# Patient Record
Sex: Male | Born: 1950
Health system: Southern US, Community
[De-identification: ages and names within clinical notes are randomized; demographics above are authoritative.]

## PROBLEM LIST (undated history)

## (undated) DIAGNOSIS — E538 Deficiency of other specified B group vitamins: Secondary | ICD-10-CM

## (undated) DIAGNOSIS — T7840XA Allergy, unspecified, initial encounter: Secondary | ICD-10-CM

## (undated) DIAGNOSIS — Z9989 Dependence on other enabling machines and devices: Secondary | ICD-10-CM

## (undated) DIAGNOSIS — E079 Disorder of thyroid, unspecified: Secondary | ICD-10-CM

## (undated) DIAGNOSIS — G4730 Sleep apnea, unspecified: Secondary | ICD-10-CM

## (undated) DIAGNOSIS — G4733 Obstructive sleep apnea (adult) (pediatric): Secondary | ICD-10-CM

## (undated) DIAGNOSIS — I10 Essential (primary) hypertension: Secondary | ICD-10-CM

## (undated) DIAGNOSIS — E785 Hyperlipidemia, unspecified: Secondary | ICD-10-CM

## (undated) HISTORY — DX: Allergy, unspecified, initial encounter: T78.40XA

## (undated) HISTORY — DX: Obstructive sleep apnea (adult) (pediatric): G47.33

## (undated) HISTORY — DX: Disorder of thyroid, unspecified: E07.9

## (undated) HISTORY — DX: Essential (primary) hypertension: I10

## (undated) HISTORY — DX: Deficiency of other specified B group vitamins: E53.8

## (undated) HISTORY — DX: Hyperlipidemia, unspecified: E78.5

## (undated) HISTORY — PX: COLONOSCOPY: SHX174

## (undated) HISTORY — DX: Obstructive sleep apnea (adult) (pediatric): Z99.89

## (undated) HISTORY — DX: Sleep apnea, unspecified: G47.30

## (undated) HISTORY — PX: HERNIA REPAIR: SHX51

## (undated) HISTORY — PX: POLYPECTOMY: SHX149

---

## 1998-07-29 ENCOUNTER — Ambulatory Visit (HOSPITAL_COMMUNITY): Admission: RE | Admit: 1998-07-29 | Discharge: 1998-07-29 | Payer: Self-pay | Admitting: Internal Medicine

## 1998-07-29 ENCOUNTER — Encounter: Payer: Self-pay | Admitting: Internal Medicine

## 2000-12-15 ENCOUNTER — Ambulatory Visit (HOSPITAL_COMMUNITY): Admission: RE | Admit: 2000-12-15 | Discharge: 2000-12-15 | Payer: Self-pay | Admitting: *Deleted

## 2000-12-31 ENCOUNTER — Encounter: Payer: Self-pay | Admitting: Emergency Medicine

## 2000-12-31 ENCOUNTER — Emergency Department (HOSPITAL_COMMUNITY): Admission: EM | Admit: 2000-12-31 | Discharge: 2000-12-31 | Payer: Self-pay | Admitting: Emergency Medicine

## 2001-01-15 ENCOUNTER — Ambulatory Visit (HOSPITAL_BASED_OUTPATIENT_CLINIC_OR_DEPARTMENT_OTHER): Admission: RE | Admit: 2001-01-15 | Discharge: 2001-01-15 | Payer: Self-pay | Admitting: Pulmonary Disease

## 2001-05-04 ENCOUNTER — Ambulatory Visit (HOSPITAL_BASED_OUTPATIENT_CLINIC_OR_DEPARTMENT_OTHER): Admission: RE | Admit: 2001-05-04 | Discharge: 2001-05-04 | Payer: Self-pay | Admitting: Pulmonary Disease

## 2004-02-25 ENCOUNTER — Ambulatory Visit: Payer: Self-pay | Admitting: Internal Medicine

## 2004-04-02 ENCOUNTER — Ambulatory Visit: Payer: Self-pay | Admitting: Internal Medicine

## 2004-12-24 ENCOUNTER — Ambulatory Visit: Payer: Self-pay | Admitting: Internal Medicine

## 2005-03-08 ENCOUNTER — Ambulatory Visit: Payer: Self-pay | Admitting: Internal Medicine

## 2005-06-14 ENCOUNTER — Ambulatory Visit: Payer: Self-pay | Admitting: Internal Medicine

## 2005-06-20 ENCOUNTER — Ambulatory Visit: Payer: Self-pay | Admitting: Internal Medicine

## 2006-01-12 ENCOUNTER — Ambulatory Visit: Payer: Self-pay | Admitting: Internal Medicine

## 2006-05-02 ENCOUNTER — Ambulatory Visit: Payer: Self-pay

## 2006-05-02 ENCOUNTER — Ambulatory Visit: Payer: Self-pay | Admitting: Endocrinology

## 2006-05-02 HISTORY — PX: OTHER SURGICAL HISTORY: SHX169

## 2006-12-16 ENCOUNTER — Encounter: Payer: Self-pay | Admitting: Internal Medicine

## 2006-12-16 DIAGNOSIS — I10 Essential (primary) hypertension: Secondary | ICD-10-CM | POA: Insufficient documentation

## 2006-12-16 DIAGNOSIS — G4733 Obstructive sleep apnea (adult) (pediatric): Secondary | ICD-10-CM | POA: Insufficient documentation

## 2007-03-07 ENCOUNTER — Ambulatory Visit: Payer: Self-pay | Admitting: Internal Medicine

## 2007-03-07 LAB — CONVERTED CEMR LAB
ALT: 31 units/L (ref 0–53)
AST: 27 units/L (ref 0–37)
Albumin: 4 g/dL (ref 3.5–5.2)
Alkaline Phosphatase: 57 units/L (ref 39–117)
BUN: 11 mg/dL (ref 6–23)
Basophils Absolute: 0 10*3/uL (ref 0.0–0.1)
Basophils Relative: 0.3 % (ref 0.0–1.0)
Bilirubin, Direct: 0.1 mg/dL (ref 0.0–0.3)
CO2: 31 meq/L (ref 19–32)
Calcium: 9.3 mg/dL (ref 8.4–10.5)
Chloride: 102 meq/L (ref 96–112)
Cholesterol: 190 mg/dL (ref 0–200)
Creatinine, Ser: 1 mg/dL (ref 0.4–1.5)
Eosinophils Absolute: 0.3 10*3/uL (ref 0.0–0.6)
Eosinophils Relative: 6.2 % — ABNORMAL HIGH (ref 0.0–5.0)
GFR calc Af Amer: 100 mL/min
GFR calc non Af Amer: 82 mL/min
Glucose, Bld: 99 mg/dL (ref 70–99)
HCT: 40 % (ref 39.0–52.0)
HDL: 29.8 mg/dL — ABNORMAL LOW (ref >39.0)
Hemoglobin: 14.1 g/dL (ref 13.0–17.0)
LDL Cholesterol: 125 mg/dL — ABNORMAL HIGH (ref 0–99)
Lymphocytes Relative: 36.8 % (ref 12.0–46.0)
MCHC: 35.2 g/dL (ref 30.0–36.0)
MCV: 85.1 fL (ref 78.0–100.0)
Monocytes Absolute: 0.4 10*3/uL (ref 0.2–0.7)
Monocytes Relative: 8.6 % (ref 3.0–11.0)
Neutro Abs: 2.5 10*3/uL (ref 1.4–7.7)
Neutrophils Relative %: 48.1 % (ref 43.0–77.0)
PSA: 0.35 ng/mL (ref 0.10–4.00)
Platelets: 212 10*3/uL (ref 150–400)
Potassium: 4 meq/L (ref 3.5–5.1)
RBC: 4.7 M/uL (ref 4.22–5.81)
RDW: 11.8 % (ref 11.5–14.6)
Sodium: 139 meq/L (ref 135–145)
TSH: 9.47 microintl units/mL — ABNORMAL HIGH (ref 0.35–5.50)
Total Bilirubin: 1 mg/dL (ref 0.3–1.2)
Total CHOL/HDL Ratio: 6.4
Total Protein: 7.6 g/dL (ref 6.0–8.3)
Triglycerides: 178 mg/dL — ABNORMAL HIGH (ref 0–149)
VLDL: 36 mg/dL (ref 0–40)
WBC: 5.1 10*3/uL (ref 4.5–10.5)

## 2007-03-15 ENCOUNTER — Ambulatory Visit: Payer: Self-pay | Admitting: Internal Medicine

## 2007-03-15 DIAGNOSIS — E039 Hypothyroidism, unspecified: Secondary | ICD-10-CM | POA: Insufficient documentation

## 2007-03-21 ENCOUNTER — Telehealth: Payer: Self-pay | Admitting: Internal Medicine

## 2007-05-08 ENCOUNTER — Telehealth: Payer: Self-pay | Admitting: Internal Medicine

## 2008-03-13 ENCOUNTER — Telehealth: Payer: Self-pay | Admitting: Internal Medicine

## 2008-03-14 ENCOUNTER — Ambulatory Visit: Payer: Self-pay | Admitting: Internal Medicine

## 2008-03-19 LAB — CONVERTED CEMR LAB
ALT: 24 units/L (ref 0–53)
AST: 26 units/L (ref 0–37)
Albumin: 4.2 g/dL (ref 3.5–5.2)
Alkaline Phosphatase: 54 units/L (ref 39–117)
BUN: 14 mg/dL (ref 6–23)
Bilirubin Urine: NEGATIVE
Bilirubin, Direct: 0.1 mg/dL (ref 0.0–0.3)
CO2: 31 meq/L (ref 19–32)
Calcium: 9.6 mg/dL (ref 8.4–10.5)
Chloride: 103 meq/L (ref 96–112)
Cholesterol: 192 mg/dL (ref 0–200)
Creatinine, Ser: 1.1 mg/dL (ref 0.4–1.5)
GFR calc Af Amer: 89 mL/min
GFR calc non Af Amer: 74 mL/min
Glucose, Bld: 91 mg/dL (ref 70–99)
HDL: 34.2 mg/dL — ABNORMAL LOW (ref >39.0)
Hemoglobin, Urine: NEGATIVE
Ketones, ur: NEGATIVE mg/dL
LDL Cholesterol: 135 mg/dL — ABNORMAL HIGH (ref 0–99)
Leukocytes, UA: NEGATIVE
Nitrite: NEGATIVE
PSA: 0.29 ng/mL (ref 0.10–4.00)
Potassium: 4.9 meq/L (ref 3.5–5.1)
Sodium: 141 meq/L (ref 135–145)
Specific Gravity, Urine: 1.01 (ref 1.000–1.03)
TSH: 2.27 microintl units/mL (ref 0.35–5.50)
Total Bilirubin: 1.1 mg/dL (ref 0.3–1.2)
Total CHOL/HDL Ratio: 5.6
Total Protein, Urine: NEGATIVE mg/dL
Total Protein: 7.9 g/dL (ref 6.0–8.3)
Triglycerides: 114 mg/dL (ref 0–149)
Urine Glucose: NEGATIVE mg/dL
Urobilinogen, UA: 0.2 (ref 0.0–1.0)
VLDL: 23 mg/dL (ref 0–40)
pH: 7 (ref 5.0–8.0)

## 2008-04-01 ENCOUNTER — Telehealth: Payer: Self-pay | Admitting: Internal Medicine

## 2008-12-23 ENCOUNTER — Ambulatory Visit: Payer: Self-pay | Admitting: Internal Medicine

## 2009-02-28 DIAGNOSIS — E538 Deficiency of other specified B group vitamins: Secondary | ICD-10-CM

## 2009-02-28 HISTORY — DX: Deficiency of other specified B group vitamins: E53.8

## 2009-11-13 ENCOUNTER — Ambulatory Visit: Payer: Self-pay | Admitting: Internal Medicine

## 2009-11-13 DIAGNOSIS — E538 Deficiency of other specified B group vitamins: Secondary | ICD-10-CM | POA: Insufficient documentation

## 2009-11-13 DIAGNOSIS — M25519 Pain in unspecified shoulder: Secondary | ICD-10-CM | POA: Insufficient documentation

## 2009-11-13 DIAGNOSIS — R209 Unspecified disturbances of skin sensation: Secondary | ICD-10-CM | POA: Insufficient documentation

## 2009-11-14 LAB — CONVERTED CEMR LAB
BUN: 13 mg/dL (ref 6–23)
CO2: 31 meq/L (ref 19–32)
Calcium: 9.2 mg/dL (ref 8.4–10.5)
Chloride: 106 meq/L (ref 96–112)
Creatinine, Ser: 1.1 mg/dL (ref 0.4–1.5)
GFR calc non Af Amer: 77.05 mL/min (ref >60)
Glucose, Bld: 93 mg/dL (ref 70–99)
Potassium: 5 meq/L (ref 3.5–5.1)
Sodium: 142 meq/L (ref 135–145)
TSH: 2.14 microintl units/mL (ref 0.35–5.50)
Vitamin B-12: 122 pg/mL — ABNORMAL LOW (ref 211–911)

## 2009-11-17 ENCOUNTER — Ambulatory Visit: Payer: Self-pay | Admitting: Internal Medicine

## 2009-11-17 ENCOUNTER — Encounter (INDEPENDENT_AMBULATORY_CARE_PROVIDER_SITE_OTHER): Payer: Self-pay | Admitting: *Deleted

## 2009-11-18 ENCOUNTER — Telehealth: Payer: Self-pay | Admitting: Internal Medicine

## 2009-12-14 ENCOUNTER — Encounter (INDEPENDENT_AMBULATORY_CARE_PROVIDER_SITE_OTHER): Payer: Self-pay | Admitting: *Deleted

## 2009-12-16 ENCOUNTER — Ambulatory Visit: Payer: Self-pay | Admitting: Gastroenterology

## 2009-12-30 ENCOUNTER — Ambulatory Visit: Payer: Self-pay | Admitting: Gastroenterology

## 2009-12-30 LAB — HM COLONOSCOPY

## 2010-01-01 ENCOUNTER — Encounter: Payer: Self-pay | Admitting: Gastroenterology

## 2010-02-19 ENCOUNTER — Ambulatory Visit: Payer: Self-pay | Admitting: Internal Medicine

## 2010-02-19 LAB — CONVERTED CEMR LAB
BUN: 15 mg/dL (ref 6–23)
CO2: 27 meq/L (ref 19–32)
Calcium: 8.9 mg/dL (ref 8.4–10.5)
Chloride: 101 meq/L (ref 96–112)
Creatinine, Ser: 0.9 mg/dL (ref 0.4–1.5)
GFR calc non Af Amer: 90.8 mL/min (ref >60.00)
Glucose, Bld: 88 mg/dL (ref 70–99)
Potassium: 3.9 meq/L (ref 3.5–5.1)
Sodium: 136 meq/L (ref 135–145)
Vitamin B-12: 581 pg/mL (ref 211–911)

## 2010-02-26 ENCOUNTER — Ambulatory Visit: Payer: Self-pay | Admitting: Internal Medicine

## 2010-02-26 DIAGNOSIS — L570 Actinic keratosis: Secondary | ICD-10-CM | POA: Insufficient documentation

## 2010-03-30 NOTE — Letter (Signed)
Summary: Pre Visit Letter Revised  [facility]  [address] [place], [place] [address]   Phone: [phone]  Fax: [phone]        11/17/2009 MRN: [id number] Shane Short [address] [place], [place]  [address]             Procedure Date:  12-30-09   Welcome to the Gastroenterology Division at [facility].    You are scheduled to see a nurse for your pre-procedure visit on 12-16-09 at 4:30p.m. on the 3rd floor at [facility], [address]  We ask that you try to arrive at our office 15 minutes prior to your appointment time to allow for check-in.  Please take a minute to review the attached form.  If you answer "Yes" to one or more of the questions on the first page, we ask that you call the person listed at your earliest opportunity.  If you answer "No" to all of the questions, please complete the rest of the form and bring it to your appointment.    Your nurse visit will consist of discussing your medical and surgical history, your immediate family medical history, and your medications.   If you are unable to list all of your medications on the form, please bring the medication bottles to your appointment and we will list them.  We will need to be aware of both prescribed and over the counter drugs.  We will need to know exact dosage information as well.    Please be prepared to read and sign documents such as consent forms, a financial agreement, and acknowledgement forms.  If necessary, and with your consent, a friend or relative is welcome to sit-in on the nurse visit with you.  Please bring your insurance card so that we may make a copy of it.  If your insurance requires a referral to see a specialist, please bring your referral form from your primary care physician.  No co-pay is required for this nurse visit.     If you cannot keep your appointment, please call [phone] to cancel or reschedule prior to your appointment date.  This allows  [place] the opportunity to schedule an appointment for another patient in need of care.    Thank you for choosing [facility] for your medical needs.  We appreciate the opportunity to care for you.  Please visit [place] at our website  to learn more about our practice.  Sincerely, The Gastroenterology Division

## 2010-03-30 NOTE — Procedures (Signed)
Summary: Colonoscopy  Patient: Shane Short Note: All result statuses are Final unless otherwise noted.  Tests: (1) Colonoscopy (COL)   COL Colonoscopy           DONE     [facility]     [address]     [place], [place]  [address]           COLONOSCOPY PROCEDURE REPORT           PATIENT:  Shane Short  MR#:  [id number]     BIRTHDATE:  1950/06/28, 59 yrs. old  GENDER:  male     ENDOSCOPIST:  Vania Rea. Jarold Motto, MD, [facility]     REF. BY:  Linda Hedges. Plotnikov, M.D.     PROCEDURE DATE:  12/30/2009     PROCEDURE:  Colonoscopy with snare polypectomy     ASA CLASS:  Class II     INDICATIONS:  Elevated Risk Screening, family history of colon     cancer MOTHER     MEDICATIONS:   Fentanyl 75 mcg IV, Versed 7 mg IV           DESCRIPTION OF PROCEDURE:   After the risks benefits and     alternatives of the procedure were thoroughly explained, informed     consent was obtained.  Digital rectal exam was performed and     revealed no abnormalities.   The LB 180AL [id number] endoscope was     introduced through the anus and advanced to the cecum, which was     identified by both the appendix and ileocecal valve, without     limitations.  The quality of the prep was excellent, using     MoviPrep.  The instrument was then slowly withdrawn as the colon     was fully examined.     <<PROCEDUREIMAGES>>           FINDINGS:  Moderate diverticulosis was found in the sigmoid to     descending colon segments.  A sessile polyp was found in the     sigmoid colon. FLAT POLYP HOT SNARE EXCISED.   Retroflexed     views in the rectum revealed no abnormalities.    The scope was     then withdrawn from the patient and the procedure completed.           COMPLICATIONS:  None     ENDOSCOPIC IMPRESSION:     1) Moderate diverticulosis in the sigmoid to descending colon     segments     2) Sessile polyp in the sigmoid colon     R/O ADENOMA.     RECOMMENDATIONS:     1) high fiber diet     2) Repeat  Colonoscopy in 5 years.     REPEAT EXAM:  No           ______________________________     Vania Rea. Jarold Motto, MD, [facility]           CC:           n.     eSIGNED:   Vania Rea. Patterson at 12/30/2009 11:19 AM           Link Snuffer, [id number]  Note: An exclamation mark (!) indicates a result that was not dispersed into the flowsheet. Document Creation Date: 12/30/2009 11:20 AM _______________________________________________________________________  (1) Order result status: Final Collection or observation date-time: 12/30/2009 11:16 Requested date-time:  Receipt date-time:  Reported date-time:  Referring Physician:   Ordering  Physician: Sheryn Bison [phone]) Specimen Source:  Source: Launa Grill Order Number: [id number] Lab site:   Appended Document: Colonoscopy     Procedures Next Due Date:    Colonoscopy: 12/2014

## 2010-03-30 NOTE — Letter (Signed)
Summary: [facility] Instructions  [facility]  [address] [place], [place] [address]   Phone: [phone]  Fax: [phone]       Shane Short    [birth year]    MRN: [id number]        Procedure Day Dorna Bloom:  Wednesday 12/30/2009     Arrival Time:  10:00 am      Procedure Time: 11:00 am     Location of Procedure:                    _ x_  [facility] (4th Floor)                        PREPARATION FOR COLONOSCOPY WITH MOVIPREP   Starting 5 days prior to your procedure Friday 10/28 do not eat nuts, seeds, popcorn, corn, beans, peas,  salads, or any raw vegetables.  Do not take any fiber supplements (e.g. Metamucil, Citrucel, and Benefiber).  THE DAY BEFORE YOUR PROCEDURE         DATE: Tuesday 11/1  1.  Drink clear liquids the entire day-NO SOLID FOOD  2.  Do not drink anything colored red or purple.  Avoid juices with pulp.  No orange juice.  3.  Drink at least 64 oz. (8 glasses) of fluid/clear liquids during the day to prevent dehydration and help the prep work efficiently.  CLEAR LIQUIDS INCLUDE: Water Jello Ice Popsicles Tea (sugar ok, no milk/cream) Powdered fruit flavored drinks Coffee (sugar ok, no milk/cream) Gatorade Juice: apple, white grape, white cranberry  Lemonade Clear bullion, consomm, broth Carbonated beverages (any kind) Strained chicken noodle soup Hard Candy                             4.  In the morning, mix first dose of MoviPrep solution:    Empty 1 Pouch A and 1 Pouch B into the disposable container    Add lukewarm drinking water to the top line of the container. Mix to dissolve    Refrigerate (mixed solution should be used within 24 hrs)  5.  Begin drinking the prep at 5:00 p.m. The MoviPrep container is divided by 4 marks.   Every 15 minutes drink the solution down to the next mark (approximately 8 oz) until the full liter is complete.   6.  Follow completed prep with 16 oz of clear liquid of your choice  (Nothing red or purple).  Continue to drink clear liquids until bedtime.  7.  Before going to bed, mix second dose of MoviPrep solution:    Empty 1 Pouch A and 1 Pouch B into the disposable container    Add lukewarm drinking water to the top line of the container. Mix to dissolve    Refrigerate  THE DAY OF YOUR PROCEDURE      DATE: Wednesday 11/2  Beginning at 6:00 a.m. (5 hours before procedure):         1. Every 15 minutes, drink the solution down to the next mark (approx 8 oz) until the full liter is complete.  2. Follow completed prep with 16 oz. of clear liquid of your choice.    3. You may drink clear liquids until 9:00 am (2 HOURS BEFORE PROCEDURE).   MEDICATION INSTRUCTIONS  Unless otherwise instructed, you should take regular prescription medications with a small sip of water   as early as possible the morning  of your procedure.         OTHER INSTRUCTIONS  You will need a responsible adult at least 59 years of age to accompany you and drive you home.   This person must remain in the waiting room during your procedure.  Wear loose fitting clothing that is easily removed.  Leave jewelry and other valuables at home.  However, you may wish to bring a book to read or  an iPod/MP3 player to listen to music as you wait for your procedure to start.  Remove all body piercing jewelry and leave at home.  Total time from sign-in until discharge is approximately 2-3 hours.  You should go home directly after your procedure and rest.  You can resume normal activities the  day after your procedure.  The day of your procedure you should not:   Drive   Make legal decisions   Operate machinery   Drink alcohol   Return to work  You will receive specific instructions about eating, activities and medications before you leave.    The above instructions have been reviewed and explained to me by   [name], RN______________________    I fully understand and  can verbalize these instructions _____________________________ Date _________

## 2010-03-30 NOTE — Assessment & Plan Note (Signed)
Summary: start B12 inj per AVP   Vital Signs:  Patient profile:   60 year old male Weight:      199 pounds Temp:     97.7 degrees F oral Pulse rate:   68 / minute Pulse rhythm:   regular Resp:     16 per minute BP sitting:   108 / 78  (left arm) Cuff size:   regular  Vitals Entered By: Lanier Prude, Beverly Gust) (November 17, 2009 3:46 PM) CC: Low B12 Is Patient Diabetic? No   CC:  Low B12.  History of Present Illness: F/u abn Vit B12 test  Current Medications (verified): 1)  Toprol Xl 25 Mg Xr24h-Tab (Metoprolol Succinate) .... Take 1 Tablet By Mouth Once A Day 2)  Synthroid 100 Mcg Tabs (Levothyroxine Sodium) .Marland Kitchen.. 1 Qd 3)  Fish Oil   Oil (Fish Oil) .Marland Kitchen.. 1 By Mouth Once Daily 4)  Aspir-Low 81 Mg Tbec (Aspirin) .Marland Kitchen.. 1 Once Daily After Meal 5)  Vitamin D3 1000 Unit  Tabs (Cholecalciferol) .Marland Kitchen.. 1 Qd 6)  Ibuprofen 600 Mg Tabs (Ibuprofen) .Marland Kitchen.. 1 By Mouth Bid  Pc X 1 Wk Then As Needed For  Pain 7)  Tramadol Hcl 50 Mg Tabs (Tramadol Hcl) .Marland Kitchen.. 1-2 Tabs By Mouth Two Times A Day As Needed Pain  Allergies (verified): No Known Drug Allergies  Review of Systems       Tired, dizzy  Physical Exam  General:  Well-developed,well-nourished,in no acute distress; alert,appropriate and cooperative throughout examination Mouth:  Oral mucosa and oropharynx without lesions or exudates.  Teeth in good repair. Lungs:  Normal respiratory effort, chest expands symmetrically. Lungs are clear to auscultation, no crackles or wheezes. Heart:  Normal rate and regular rhythm. S1 and S2 normal without gallop, murmur, click, rub or other extra sounds. Abdomen:  Bowel sounds positive,abdomen soft and non-tender without masses, organomegaly or hernias noted.   Impression & Recommendations:  Problem # 1:  VITAMIN B12 DEFICIENCY (ICD-266.2) Assessment New Condition discussed as well as Rx options. See meds Orders: Vit B12 1000 mcg (J3420) Admin of Therapeutic Inj  intramuscular or subcutaneous  (29562)  Complete Medication List: 1)  Toprol Xl 25 Mg Xr24h-tab (Metoprolol succinate) .... Take 1 tablet by mouth once a day 2)  Synthroid 100 Mcg Tabs (Levothyroxine sodium) .Marland Kitchen.. 1 qd 3)  Fish Oil Oil (Fish oil) .Marland Kitchen.. 1 by mouth once daily 4)  Aspir-low 81 Mg Tbec (Aspirin) .Marland Kitchen.. 1 once daily after meal 5)  Vitamin D3 1000 Unit Tabs (Cholecalciferol) .Marland Kitchen.. 1 qd 6)  Ibuprofen 600 Mg Tabs (Ibuprofen) .Marland Kitchen.. 1 by mouth bid  pc x 1 wk then as needed for  pain 7)  Tramadol Hcl 50 Mg Tabs (Tramadol hcl) .Marland Kitchen.. 1-2 tabs by mouth two times a day as needed pain 8)  Cyanocobalamin 1000 Mcg/ml Soln (Cyanocobalamin) .Marland Kitchen.. 1 ml subcutaneously qd x 5 d, then q 1 wk weekly x 4 weeks, then q 2 wks  Patient Instructions: 1)  Please schedule a follow-up appointment in 3 months. 2)  BMP prior to visit, ICD-9: 3)  Vit B12 266.20 Prescriptions: CYANOCOBALAMIN 1000 MCG/ML SOLN (CYANOCOBALAMIN) 1 ml subcutaneously qd x 5 d, then q 1 wk weekly x 4 weeks, then q 2 wks  #30 ml x 6   Entered and Authorized by:   Tresa Garter MD   Signed by:   Tresa Garter MD on 11/17/2009   Method used:   Print then Give to Patient   RxID:  [id number]    Medication Administration  Injection # 1:    Medication: Vit B12 1000 mcg    Diagnosis: VITAMIN B12 DEFICIENCY (ICD-266.2)    Route: IM    Site: L deltoid    Exp Date: 07/30/2011    Lot #: [id number]    Mfr: American Regent    Patient tolerated injection without complications    Given by: Lanier Prude, CMA(AAMA) (November 17, 2009 4:43 PM)  Orders Added: 1)  Vit B12 1000 mcg [J3420] 2)  Admin of Therapeutic Inj  intramuscular or subcutaneous [96372] 3)  Est. Patient Level III [13086]

## 2010-03-30 NOTE — Progress Notes (Signed)
  Phone Note Call from Patient Call back at Home Phone [phone]   Summary of Call: Pt's wife called. They are requesting rx for needles for B12. OK?  Initial call taken by: Lamar Sprinkles, CMA,  November 18, 2009 1:56 PM  Follow-up for Phone Call        ok Thx Follow-up by: Tresa Garter MD,  November 18, 2009 5:43 PM    New/Updated Medications: * SYRINGE 23GUAGE NEEDLE use w/b12 Prescriptions: SYRINGE 23GUAGE NEEDLE use w/b12  #3 x 3   Entered by:   Lamar Sprinkles, CMA   Authorized by:   Tresa Garter MD   Signed by:   Lamar Sprinkles, CMA on 11/18/2009   Method used:   Faxed to ...       [facility]* (retail)       [address] [place], [place]  [address]       Ph: [phone]       Fax: [phone]   RxID:   [id number]

## 2010-03-30 NOTE — Letter (Signed)
Summary: Patient Notice- Polyp Results  [facility]  [address] [place], [place] [address]   Phone: [phone]  Fax: [phone]        January 01, 2010 MRN: [id number]    Shane Short [address] [place], [place]  [address]    Dear Mr. Shane Short,  I am pleased to inform you that the colon polyp(s) removed during your recent colonoscopy was (were) found to be benign (no cancer detected) upon pathologic examination.  I recommend you have a repeat colonoscopy examination in 5_ years to look for recurrent polyps, as having colon polyps increases your risk for having recurrent polyps or even colon cancer in the future.  Should you develop new or worsening symptoms of abdominal pain, bowel habit changes or bleeding from the rectum or bowels, please schedule an evaluation with either your primary care physician or with me.  Additional information/recommendations:  _XX_ No further action with gastroenterology is needed at this time. Please      follow-up with your primary care physician for your other healthcare      needs.  __ Please call [phone] to schedule a return visit to review your      situation.  __ Please keep your follow-up visit as already scheduled.  __ Continue treatment plan as outlined the day of your exam.  Please call Shane Short if you are having persistent problems or have questions about your condition that have not been fully answered at this time.  Sincerely,  Mardella Layman MD [facility]  This letter has been electronically signed by your physician.  Appended Document: Patient Notice- Polyp Results letter mailed

## 2010-03-30 NOTE — Miscellaneous (Signed)
Summary: direct COL--ch./previsit  Clinical Lists Changes  Medications: Added new medication of MOVIPREP 100 GM  SOLR (PEG-KCL-NACL-NASULF-NA ASC-C) As directed - Signed Rx of MOVIPREP 100 GM  SOLR (PEG-KCL-NACL-NASULF-NA ASC-C) As directed;  #1 x 0;  Signed;  Entered by: [name] RN;  Authorized by: Mardella Layman MD [facility];  Method used: Electronically to [facility]*, [address] [place], [place], [place]  [address], Ph: [phone], Fax: [phone] Observations: Added new observation of ALLERGY REV: Done (12/16/2009 16:19)    Prescriptions: MOVIPREP 100 GM  SOLR (PEG-KCL-NACL-NASULF-NA ASC-C) As directed  #1 x 0   Entered by:   [name] RN   Authorized by:   Mardella Layman MD [facility]   Signed by:   [name] RN on 12/16/2009   Method used:   Electronically to        [facility]* (retail)       [address] [place], [place]  [address]       Ph: [phone]       Fax: [phone]   RxID:   [id number]

## 2010-03-30 NOTE — Assessment & Plan Note (Signed)
Summary: FU/ NWS  #   Vital Signs:  Patient profile:   60 year old male Weight:      201 pounds O2 Sat:      96 % on Room air Temp:     97.4 degrees F oral Pulse rate:   51 / minute BP sitting:   118 / 80  (left arm) Cuff size:   regular  Vitals Entered By: Bill Salinas CMA (November 13, 2009 7:51 AM)  O2 Flow:  Room air  History of Present Illness: The patient presents for a follow up of hypertension, hyperlipidemia  C/o constant L shoulder pain and tingling in L arm/hand. L shoulder w/decr ROM   Current Medications (verified): 1)  Toprol Xl 25 Mg Xr24h-Tab (Metoprolol Succinate) .... Take 1 Tablet By Mouth Once A Day 2)  Synthroid 100 Mcg Tabs (Levothyroxine Sodium) .Marland Kitchen.. 1 Qd 3)  Fish Oil   Oil (Fish Oil) .Marland Kitchen.. 1 By Mouth Once Daily 4)  Aspir-Low 81 Mg Tbec (Aspirin) .Marland Kitchen.. 1 Once Daily After Meal 5)  Vitamin D3 1000 Unit  Tabs (Cholecalciferol) .Marland Kitchen.. 1 Qd  Allergies (verified): No Known Drug Allergies  Past History:  Past Medical History: Hypertension Hypothyroidism OSA on CPAP Low HDL Vit B12 2011  Social History: Occupation: lifting at work Married Quit smoking 30 years ago  Review of Systems  The patient denies fever, chest pain, prolonged cough, abdominal pain, and difficulty walking.    Physical Exam  General:  Well-developed,well-nourished,in no acute distress; alert,appropriate and cooperative throughout examination Eyes:  No corneal or conjunctival inflammation noted. EOMI. Perrla. Funduscopic exam benign, without hemorrhages, exudates or papilledema. Vision grossly normal. Nose:  External nasal examination shows no deformity or inflammation. Nasal mucosa are pink and moist without lesions or exudates. Mouth:  Oral mucosa and oropharynx without lesions or exudates.  Teeth in good repair. Lungs:  Normal respiratory effort, chest expands symmetrically. Lungs are clear to auscultation, no crackles or wheezes. Heart:  Normal rate and regular rhythm. S1 and  S2 normal without gallop, murmur, click, rub or other extra sounds. Abdomen:  Bowel sounds positive,abdomen soft and non-tender without masses, organomegaly or hernias noted. Msk:  Lumbar-sacral spine is tender to palpation over paraspinal muscles and painfull with the decreased ROM  L shoulder w/decr ROM and tender Neurologic:  WNL Skin:  Intact without suspicious lesions or rashes Psych:  Cognition and judgment appear intact. Alert and cooperative with normal attention span and concentration. No apparent delusions, illusions, hallucinations   Impression & Recommendations:  Problem # 1:  SHOULDER PAIN (ICD-719.41) L shoulder impingement Assessment New  His updated medication list for this problem includes:    Aspir-low 81 Mg Tbec (Aspirin) .Marland Kitchen... 1 once daily after meal    Ibuprofen 600 Mg Tabs (Ibuprofen) .Marland Kitchen... 1 by mouth bid  pc x 1 wk then as needed for  pain    Tramadol Hcl 50 Mg Tabs (Tramadol hcl) .Marland Kitchen... 1-2 tabs by mouth two times a day as needed pain RTC for inj and Xray if not better in 3-4 wks  Problem # 2:  PARESTHESIA (ICD-782.0) L arm Assessment: New  Orders: TLB-B12, Serum-Total ONLY ([address]-U04) TLB-BMP (Basic Metabolic Panel-BMET) (80048-METABOL) TLB-TSH (Thyroid Stimulating Hormone) (84443-TSH)  Problem # 3:  HYPOTHYROIDISM (ICD-244.9) Assessment: Unchanged  His updated medication list for this problem includes:    Synthroid 100 Mcg Tabs (Levothyroxine sodium) .Marland Kitchen... 1 qd  Orders: TLB-B12, Serum-Total ONLY ([address]-J19) TLB-BMP (Basic Metabolic Panel-BMET) (80048-METABOL) TLB-TSH (Thyroid Stimulating Hormone) (84443-TSH)  Problem #  4:  HYPERTENSION (ICD-401.9) Assessment: Unchanged  His updated medication list for this problem includes:    Toprol Xl 25 Mg Xr24h-tab (Metoprolol succinate) .Marland Kitchen... Take 1 tablet by mouth once a day  Orders: TLB-B12, Serum-Total ONLY ([address]-U04) TLB-BMP (Basic Metabolic Panel-BMET) (80048-METABOL) TLB-TSH (Thyroid Stimulating  Hormone) (84443-TSH)  Problem # 5:  PARESTHESIA (ICD-782.0) Assessment: New  Orders: TLB-B12, Serum-Total ONLY ([address]-J19) TLB-BMP (Basic Metabolic Panel-BMET) (80048-METABOL) TLB-TSH (Thyroid Stimulating Hormone) (84443-TSH)  Problem # 6:  VITAMIN B12 DEFICIENCY (ICD-266.2) Assessment: New Start Rx next wk  Complete Medication List: 1)  Toprol Xl 25 Mg Xr24h-tab (Metoprolol succinate) .... Take 1 tablet by mouth once a day 2)  Synthroid 100 Mcg Tabs (Levothyroxine sodium) .Marland Kitchen.. 1 qd 3)  Fish Oil Oil (Fish oil) .Marland Kitchen.. 1 by mouth once daily 4)  Aspir-low 81 Mg Tbec (Aspirin) .Marland Kitchen.. 1 once daily after meal 5)  Vitamin D3 1000 Unit Tabs (Cholecalciferol) .Marland Kitchen.. 1 qd 6)  Ibuprofen 600 Mg Tabs (Ibuprofen) .Marland Kitchen.. 1 by mouth bid  pc x 1 wk then as needed for  pain 7)  Tramadol Hcl 50 Mg Tabs (Tramadol hcl) .Marland Kitchen.. 1-2 tabs by mouth two times a day as needed pain  Other Orders: Admin 1st Vaccine ([address]) Flu Vaccine 35yrs + ([address]) Gastroenterology Referral (GI)  Patient Instructions: 1)  Use stretching and balance exercises that I have provided (15 min. or longer every day)  2)  Contour pillow 3)  Please schedule a follow-up appointment in 6 months well w/labs. Prescriptions: SYNTHROID 100 MCG TABS (LEVOTHYROXINE SODIUM) 1 qd  #30 Each x 11   Entered and Authorized by:   Tresa Garter MD   Signed by:   Tresa Garter MD on 11/13/2009   Method used:   Electronically to        [facility]* (retail)       [address] [place], [place]  [address]       Ph: [phone]       Fax: [phone]   RxID:   [id number] TOPROL XL 25 MG XR24H-TAB (METOPROLOL SUCCINATE) Take 1 tablet by mouth once a day  #30 Each x 11   Entered and Authorized by:   Tresa Garter MD   Signed by:   Tresa Garter MD on 11/13/2009   Method used:   Electronically to        [facility]* (retail)       [address] [place], [place]  [address]       Ph: [phone]       Fax: [phone]   RxID:   [id number] TRAMADOL HCL 50 MG TABS (TRAMADOL HCL) 1-2 tabs by mouth two times a day as needed pain  #100 x 1   Entered and Authorized by:   Tresa Garter MD   Signed by:   Tresa Garter MD on 11/13/2009   Method used:   Electronically to        [facility]* (retail)       [address] [place], [place]  [address]       Ph: [phone]       Fax: [phone]   RxID:   [id number] IBUPROFEN 600 MG TABS (IBUPROFEN) 1 by  mouth bid  pc x 1 wk then as needed for  pain  #60 x 3   Entered and Authorized by:   Tresa Garter MD   Signed by:   Tresa Garter MD on 11/13/2009   Method used:   Electronically to        [facility]* (retail)       [address] [place], [place]  [address]       Ph: [phone]       Fax: [phone]   RxID:   [id number]      Flu Vaccine Consent Questions     Do you have a history of severe allergic reactions to this vaccine? no    Any prior history of allergic reactions to egg and/or gelatin? no    Do you have a sensitivity to the preservative Thimersol? no    Do you have a past history of Guillan-Barre Syndrome? no    Do you currently have an acute febrile illness? no    Have you ever had a severe reaction to latex? no    Vaccine information given and explained to patient? yes    Are you currently pregnant? no    Lot Number:[id number]   Exp Date:08/28/2010   Site Given  Left Deltoid IMflu

## 2010-04-01 NOTE — Assessment & Plan Note (Signed)
Summary: 3 mo rov /nws  #   Vital Signs:  Patient profile:   60 year old male Weight:      186 pounds Temp:     98.8 degrees F oral Pulse rate:   76 / minute Pulse rhythm:   regular Resp:     16 per minute BP sitting:   112 / 76  (left arm) Cuff size:   regular  Vitals Entered By: Lanier Prude, CMA(AAMA) (February 26, 2010 2:50 PM) CC: 3 mo f/u  Is Patient Diabetic? No Comments pt is not taking Fish oil, ibuprofen or Tramadol   CC:  3 mo f/u .  History of Present Illness: F/u B12 def, HTN, hypothyroid C/o skin growth on face  Current Medications (verified): 1)  Toprol Xl 25 Mg Xr24h-Tab (Metoprolol Succinate) .... Take 1 Tablet By Mouth Once A Day 2)  Synthroid 100 Mcg Tabs (Levothyroxine Sodium) .Marland Kitchen.. 1 Qd 3)  Fish Oil   Oil (Fish Oil) .Marland Kitchen.. 1 By Mouth Once Daily 4)  Aspir-Low 81 Mg Tbec (Aspirin) .Marland Kitchen.. 1 Once Daily After Meal 5)  Vitamin D3 1000 Unit  Tabs (Cholecalciferol) .Marland Kitchen.. 1 Qd 6)  Ibuprofen 600 Mg Tabs (Ibuprofen) .Marland Kitchen.. 1 By Mouth Bid  Pc X 1 Wk Then As Needed For  Pain 7)  Tramadol Hcl 50 Mg Tabs (Tramadol Hcl) .Marland Kitchen.. 1-2 Tabs By Mouth Two Times A Day As Needed Pain 8)  Cyanocobalamin 1000 Mcg/ml Soln (Cyanocobalamin) .Marland Kitchen.. 1 Ml Subcutaneously Qd X 5 D, Then Q 1 Wk Weekly X 4 Weeks, Then Q 2 Wks 9)  3ml Syringe 23guage Needle .... Use W/b12  Allergies (verified): No Known Drug Allergies  Past History:  Past Medical History: Last updated: 11/13/2009 Hypertension Hypothyroidism OSA on CPAP Low HDL Vit B12 2011  Social History: Last updated: 11/13/2009 Occupation: lifting at work Married Quit smoking 30 years ago  Review of Systems  The patient denies anorexia, chest pain, syncope, hemoptysis, and abdominal pain.    Physical Exam  General:  Well-developed,well-nourished,in no acute distress; alert,appropriate and cooperative throughout examination Head:  Normocephalic and atraumatic without obvious abnormalities. Eyes:  No corneal or conjunctival  inflammation noted. EOMI. Perrla Nose:  External nasal examination shows no deformity or inflammation. Nasal mucosa are pink and moist without lesions or exudates. Mouth:  Oral mucosa and oropharynx without lesions or exudates.  Teeth in good repair. Neck:  No deformities, masses, or tenderness noted. Lungs:  Normal respiratory effort, chest expands symmetrically. Lungs are clear to auscultation, no crackles or wheezes. Heart:  Normal rate and regular rhythm. S1 and S2 normal without gallop, murmur, click, rub or other extra sounds. Abdomen:  Bowel sounds positive,abdomen soft and non-tender without masses, organomegaly or hernias noted. Msk:  Lumbar-sacral spine is tender to palpation over paraspinal muscles and painfull with the decreased ROM  L shoulder w/decr ROM and tender Extremities:  B no edema Neurologic:  No cranial nerve deficits noted. Station and gait are normal. Plantar reflexes are down-going bilaterally. DTRs are symmetrical throughout. Sensory, motor and coordinative functions appear intact. Skin:  L temple AK x2 and x1 on R Psych:  Cognition and judgment appear intact. Alert and cooperative with normal attention span and concentration. No apparent delusions, illusions, hallucinations   Impression & Recommendations:  Problem # 1:  VITAMIN B12 DEFICIENCY (ICD-266.2) Assessment Improved On the regimen of medicine(s) reflected in the chart   The labs were reviewed with the patient.   Problem # 2:  HYPOTHYROIDISM (ICD-244.9) Assessment: Unchanged  His updated medication list for this problem includes:    Synthroid 100 Mcg Tabs (Levothyroxine sodium) .Marland Kitchen... 1 qd  Labs Reviewed: TSH: 2.14 (11/13/2009)    Chol: 192 (03/14/2008)   HDL: 34.2 (03/14/2008)   LDL: 135 (03/14/2008)   TG: 114 (03/14/2008)  Problem # 3:  HYPERTENSION (ICD-401.9) Assessment: Unchanged  His updated medication list for this problem includes:    Toprol Xl 25 Mg Xr24h-tab (Metoprolol succinate) .Marland Kitchen...  Take 1 tablet by mouth once a day  BP today: 112/76 Prior BP: 108/78 (11/17/2009)  Labs Reviewed: K+: 3.9 (02/19/2010) Creat: : 0.9 (02/19/2010)   Chol: 192 (03/14/2008)   HDL: 34.2 (03/14/2008)   LDL: 135 (03/14/2008)   TG: 114 (03/14/2008)  Problem # 4:  SHOULDER PAIN (ICD-719.41) resolved Assessment: Improved  His updated medication list for this problem includes:    Aspir-low 81 Mg Tbec (Aspirin) .Marland Kitchen... 1 once daily after meal    Ibuprofen 600 Mg Tabs (Ibuprofen) .Marland Kitchen... 1 by mouth bid  pc x 1 wk then as needed for  pain    Tramadol Hcl 50 Mg Tabs (Tramadol hcl) .Marland Kitchen... 1-2 tabs by mouth two times a day as needed pain  Problem # 5:  ACTINIC KERATOSIS (ICD-702.0) Assessment: New  Procedure: cryo Indication: AK(s)x3 Risks incl. scar(s), incomplete removal, ect.  and benefits discussed    3  lesion(s) on face was/were treated with liqid N2 in usual fasion.  Tolerated well. Compl. none. Wound care instructions given.   Orders: Cryotherapy/Destruction benign or premalignant lesion (1st lesion)  (17000) Cryotherapy/Destruction benign or premalignant lesion (2nd-14th lesions) (17003)  Complete Medication List: 1)  Toprol Xl 25 Mg Xr24h-tab (Metoprolol succinate) .... Take 1 tablet by mouth once a day 2)  Synthroid 100 Mcg Tabs (Levothyroxine sodium) .Marland Kitchen.. 1 qd 3)  Fish Oil Oil (Fish oil) .Marland Kitchen.. 1 by mouth once daily 4)  Aspir-low 81 Mg Tbec (Aspirin) .Marland Kitchen.. 1 once daily after meal 5)  Vitamin D3 1000 Unit Tabs (Cholecalciferol) .Marland Kitchen.. 1 qd 6)  Ibuprofen 600 Mg Tabs (Ibuprofen) .Marland Kitchen.. 1 by mouth bid  pc x 1 wk then as needed for  pain 7)  Tramadol Hcl 50 Mg Tabs (Tramadol hcl) .Marland Kitchen.. 1-2 tabs by mouth two times a day as needed pain 8)  Cyanocobalamin 1000 Mcg/ml Soln (Cyanocobalamin) .Marland Kitchen.. 1 ml subcutaneously qd x 5 d, then q 1 wk weekly x 4 weeks, then q 2 wks 9)  3ml Syringe 23guage Needle  .... Use w/b12  Patient Instructions: 1)  Please schedule a follow-up appointment in 6 months well  w/lbs and Vit B12 266.20.   Orders Added: 1)  Est. Patient Level IV [16109] 2)  Cryotherapy/Destruction benign or premalignant lesion (1st lesion)  [17000] 3)  Cryotherapy/Destruction benign or premalignant lesion (2nd-14th lesions) [17003]

## 2010-04-27 ENCOUNTER — Telehealth: Payer: Self-pay | Admitting: Internal Medicine

## 2010-05-06 NOTE — Progress Notes (Signed)
Summary: refill/syringes  Phone Note Refill Request   Refills Requested: Medication #1:  SYRINGE 23GUAGE NEEDLE use w/b12. Initial call taken by: Burnard Leigh [facility]),  April 27, 2010 10:47 AM    Prescriptions: SYRINGE 23GUAGE NEEDLE use w/b12  #3 x 3   Entered by:   Burnard Leigh CMA(AAMA)   Authorized by:   Tresa Garter MD   Signed by:   Burnard Leigh [facility]) on 04/27/2010   Method used:   Faxed to ...       [facility] [address] (retail)       [phone]       [place], [place]  [address]       Ph: [phone]       Fax: [phone]   RxID:   [id number]

## 2010-07-16 NOTE — Cardiovascular Report (Signed)
[place]  Patient:    JOHNE, BUCKLE Visit Number: [id number] MRN: [id number]          Service Type: CAT Location: [facility] 2899 12 Attending Physician:  Daisey Must Dictated by:   Daisey Must, M.D. [facility] Proc. Date: 12/15/00 Admit Date:  12/15/2000 Discharge Date: 12/15/2000   CC:         Sonda Primes, M.D. [facility]  Cardiac Catheterization Laboratory   Cardiac Catheterization  PROCEDURES PERFORMED: Left heart catheterization with coronary angiography and left ventriculography.  INDICATIONS: The patient is a 60 year old male, with a history of chest discomfort and dyspnea. A stress Cardiolite done in the office showed attenuation of the inferior base and a possible small apical wall infarction with no evidence of ischemia. Ejection fraction was 52% by Cardiolite. We did an echocardiogram which showed mild generalized hypokinesis with an ejection fraction estimated at 45-55%. Because of the mild LV dysfunction and possibility of prior infarct, we opted to proceed with cardiac catheterization to rule out coronary artery disease.  DESCRIPTION OF PROCEDURE: A 6 French sheath was placed in the right femoral artery. Standard Judkins 6 French catheters were utilized. Contrast was Omnipaque. There were no complications.  RESULTS:  HEMODYNAMICS: Left ventricular pressure 130/14. Aortic pressure 130/82.  There was no aortic valve gradient.  LEFT VENTRICULOGRAM: There is very mild hypokinesis particularly involving the anterior apical walls. Ejection fraction is calculated at 57%. There is no mitral regurgitation.  CORONARY ARTERIOGRAPHY: (Left dominant).  Left main: Normal.  Left anterior descending: The left anterior descending artery has diffuse luminal irregularities in the mid to distal vessel. The LAD gives rise to a normal sized branching first diagonal and a small second diagonal.  Left circumflex: The left circumflex is a  dominant vessel. It gives rise to a normal sized ramus intermedius, small OM-1, normal sized OM-2. The distal circumflex gives rise to a normal sized first posterolateral branch, a small second posterolateral branch and a normal sized posterior descending artery. There are diffuse minor luminal irregularities in the distal circumflex.  Right coronary artery: The right coronary artery is a relatively small nondominant vessel. It does supply a few septal perforators. It is free of angiographic disease.  IMPRESSIONS: 1. Left ventricular systolic function in the lower range of normal. 2. No significant coronary artery disease identified. Dictated by:   Daisey Must, M.D. [facility] Attending Physician:  Daisey Must DD:  12/15/00 TD:  12/16/00 Job: [id number]

## 2010-08-20 ENCOUNTER — Other Ambulatory Visit: Payer: Self-pay | Admitting: Internal Medicine

## 2010-08-20 ENCOUNTER — Other Ambulatory Visit: Payer: Self-pay

## 2010-08-20 DIAGNOSIS — E538 Deficiency of other specified B group vitamins: Secondary | ICD-10-CM

## 2010-08-20 DIAGNOSIS — Z0000 Encounter for general adult medical examination without abnormal findings: Secondary | ICD-10-CM

## 2010-08-20 DIAGNOSIS — Z0389 Encounter for observation for other suspected diseases and conditions ruled out: Secondary | ICD-10-CM

## 2010-08-23 ENCOUNTER — Other Ambulatory Visit (INDEPENDENT_AMBULATORY_CARE_PROVIDER_SITE_OTHER): Payer: Self-pay

## 2010-08-23 DIAGNOSIS — E538 Deficiency of other specified B group vitamins: Secondary | ICD-10-CM

## 2010-08-23 DIAGNOSIS — Z0389 Encounter for observation for other suspected diseases and conditions ruled out: Secondary | ICD-10-CM

## 2010-08-23 DIAGNOSIS — Z0000 Encounter for general adult medical examination without abnormal findings: Secondary | ICD-10-CM

## 2010-08-23 LAB — URINALYSIS
Bilirubin Urine: NEGATIVE
Hgb urine dipstick: NEGATIVE
Leukocytes, UA: NEGATIVE
Nitrite: NEGATIVE
pH: 6 (ref 5.0–8.0)

## 2010-08-23 LAB — CBC WITH DIFFERENTIAL/PLATELET
Basophils Relative: 0.6 % (ref 0.0–3.0)
Eosinophils Relative: 7 % — ABNORMAL HIGH (ref 0.0–5.0)
Hemoglobin: 13.9 g/dL (ref 13.0–17.0)
Lymphocytes Relative: 34.9 % (ref 12.0–46.0)
Monocytes Relative: 7.9 % (ref 3.0–12.0)
Neutro Abs: 2.9 10*3/uL (ref 1.4–7.7)
RBC: 4.65 Mil/uL (ref 4.22–5.81)

## 2010-08-23 LAB — TSH: TSH: 2.27 u[IU]/mL (ref 0.35–5.50)

## 2010-08-23 LAB — HEPATIC FUNCTION PANEL
Albumin: 4.2 g/dL (ref 3.5–5.2)
Alkaline Phosphatase: 62 U/L (ref 39–117)
Total Bilirubin: 0.5 mg/dL (ref 0.3–1.2)

## 2010-08-23 LAB — LIPID PANEL
HDL: 36.9 mg/dL — ABNORMAL LOW (ref >39.00)
LDL Cholesterol: 124 mg/dL — ABNORMAL HIGH (ref 0–99)
Total CHOL/HDL Ratio: 5
Triglycerides: 129 mg/dL (ref 0.0–149.0)
VLDL: 25.8 mg/dL (ref 0.0–40.0)

## 2010-08-23 LAB — BASIC METABOLIC PANEL
CO2: 29 mEq/L (ref 19–32)
Chloride: 103 mEq/L (ref 96–112)
Potassium: 5 mEq/L (ref 3.5–5.1)
Sodium: 139 mEq/L (ref 135–145)

## 2010-08-25 ENCOUNTER — Encounter: Payer: Self-pay | Admitting: Internal Medicine

## 2010-08-27 ENCOUNTER — Ambulatory Visit (INDEPENDENT_AMBULATORY_CARE_PROVIDER_SITE_OTHER): Payer: BC Managed Care – PPO | Admitting: Internal Medicine

## 2010-08-27 ENCOUNTER — Encounter: Payer: Self-pay | Admitting: Internal Medicine

## 2010-08-27 VITALS — BP 116/72 | HR 64 | Temp 97.7°F | Resp 16 | Ht 69.0 in | Wt 198.0 lb

## 2010-08-27 DIAGNOSIS — E538 Deficiency of other specified B group vitamins: Secondary | ICD-10-CM

## 2010-08-27 DIAGNOSIS — Z0000 Encounter for general adult medical examination without abnormal findings: Secondary | ICD-10-CM

## 2010-08-27 DIAGNOSIS — I10 Essential (primary) hypertension: Secondary | ICD-10-CM

## 2010-08-27 DIAGNOSIS — E039 Hypothyroidism, unspecified: Secondary | ICD-10-CM

## 2010-08-27 DIAGNOSIS — Z23 Encounter for immunization: Secondary | ICD-10-CM

## 2010-08-27 NOTE — Assessment & Plan Note (Signed)
We discussed age appropriate health related issues, including available/recomended screening tests and vaccinations. We discussed a need for adhering to healthy diet and exercise. Labs/EKG were reviewed/ordered. All questions were answered.   

## 2010-08-27 NOTE — Assessment & Plan Note (Signed)
On shots 

## 2010-08-27 NOTE — Assessment & Plan Note (Signed)
On Rx 

## 2010-08-27 NOTE — Assessment & Plan Note (Signed)
On rx 

## 2010-08-27 NOTE — Patient Instructions (Signed)
Wt Readings from Last 3 Encounters:  08/27/10 198 lb (89.812 kg)  02/26/10 186 lb (84.369 kg)  11/17/09 199 lb (90.266 kg)    Loose weight please.

## 2010-08-27 NOTE — Progress Notes (Signed)
  Subjective:    Patient ID: Shane Short, male    DOB: 01/22/1951, 60 y.o.   MRN: [id number]  HPI  He appears well, in no apparent distress.  Alert and oriented times three, pleasant and cooperative. Vital signs are as documented in vital signs section.   Review of Systems  Constitutional: Negative for appetite change, fatigue and unexpected weight change.  HENT: Negative for nosebleeds, congestion, sore throat, sneezing, trouble swallowing and neck pain.   Eyes: Negative for itching and visual disturbance.  Respiratory: Negative for cough.   Cardiovascular: Negative for chest pain, palpitations and leg swelling.  Gastrointestinal: Negative for nausea, diarrhea, blood in stool and abdominal distention.  Genitourinary: Negative for frequency and hematuria.  Musculoskeletal: Negative for back pain, joint swelling and gait problem.  Skin: Negative for rash.  Neurological: Negative for dizziness, tremors, speech difficulty and weakness.  Psychiatric/Behavioral: Negative for sleep disturbance, dysphoric mood and agitation. The patient is not nervous/anxious.    Wt Readings from Last 3 Encounters:  08/27/10 198 lb (89.812 kg)  02/26/10 186 lb (84.369 kg)  11/17/09 199 lb (90.266 kg)        Objective:   Physical Exam  Constitutional: He is oriented to person, place, and time. He appears well-developed and well-nourished. No distress.  HENT:  Head: Normocephalic and atraumatic.  Right Ear: External ear normal.  Left Ear: External ear normal.  Nose: Nose normal.  Mouth/Throat: Oropharynx is clear and moist. No oropharyngeal exudate.  Eyes: Conjunctivae and EOM are normal. Pupils are equal, round, and reactive to light. Right eye exhibits no discharge. Left eye exhibits no discharge. No scleral icterus.  Neck: Normal range of motion. Neck supple. No JVD present. No tracheal deviation present. No thyromegaly present.  Cardiovascular: Normal rate, regular rhythm, normal heart sounds  and intact distal pulses.  Exam reveals no gallop and no friction rub.   No murmur heard. Pulmonary/Chest: Effort normal and breath sounds normal. No stridor. No respiratory distress. He has no wheezes. He has no rales. He exhibits no tenderness.  Abdominal: Soft. Bowel sounds are normal. He exhibits no distension and no mass. There is no tenderness. There is no rebound and no guarding.  Genitourinary: Rectum normal, prostate normal and penis normal. Guaiac negative stool. No penile tenderness.  Musculoskeletal: Normal range of motion. He exhibits no edema and no tenderness.  Lymphadenopathy:    He has no cervical adenopathy.  Neurological: He is alert and oriented to person, place, and time. He has normal reflexes. No cranial nerve deficit. He exhibits normal muscle tone. Coordination normal.  Skin: Skin is warm and dry. No rash noted. He is not diaphoretic. No erythema. No pallor.  Psychiatric: He has a normal mood and affect. His behavior is normal. Judgment and thought content normal.       Lab Results  Component Value Date   WBC 5.9 08/23/2010   HGB 13.9 08/23/2010   HCT 40.3 08/23/2010   PLT 210.0 08/23/2010   CHOL 187 08/23/2010   TRIG 129.0 08/23/2010   HDL 36.90* 08/23/2010   ALT 19 08/23/2010   AST 22 08/23/2010   NA 139 08/23/2010   K 5.0 08/23/2010   CL 103 08/23/2010   CREATININE 1.1 08/23/2010   BUN 15 08/23/2010   CO2 29 08/23/2010   TSH 2.27 08/23/2010   PSA 0.40 08/23/2010      Assessment & Plan:

## 2010-08-30 ENCOUNTER — Other Ambulatory Visit: Payer: Self-pay | Admitting: *Deleted

## 2010-08-30 DIAGNOSIS — E039 Hypothyroidism, unspecified: Secondary | ICD-10-CM

## 2010-08-30 DIAGNOSIS — E538 Deficiency of other specified B group vitamins: Secondary | ICD-10-CM

## 2010-08-30 DIAGNOSIS — Z0389 Encounter for observation for other suspected diseases and conditions ruled out: Secondary | ICD-10-CM

## 2010-08-30 DIAGNOSIS — Z0000 Encounter for general adult medical examination without abnormal findings: Secondary | ICD-10-CM

## 2010-11-15 ENCOUNTER — Ambulatory Visit (INDEPENDENT_AMBULATORY_CARE_PROVIDER_SITE_OTHER): Payer: BC Managed Care – PPO | Admitting: *Deleted

## 2010-11-15 DIAGNOSIS — Z23 Encounter for immunization: Secondary | ICD-10-CM

## 2010-11-23 ENCOUNTER — Other Ambulatory Visit: Payer: Self-pay | Admitting: Internal Medicine

## 2010-12-24 ENCOUNTER — Other Ambulatory Visit: Payer: Self-pay | Admitting: Internal Medicine

## 2010-12-27 ENCOUNTER — Other Ambulatory Visit: Payer: Self-pay | Admitting: Internal Medicine

## 2011-02-10 ENCOUNTER — Other Ambulatory Visit: Payer: Self-pay | Admitting: Internal Medicine

## 2011-05-23 ENCOUNTER — Other Ambulatory Visit: Payer: Self-pay | Admitting: Internal Medicine

## 2011-06-13 ENCOUNTER — Other Ambulatory Visit: Payer: Self-pay | Admitting: Family Medicine

## 2011-06-13 ENCOUNTER — Ambulatory Visit (INDEPENDENT_AMBULATORY_CARE_PROVIDER_SITE_OTHER): Payer: BC Managed Care – PPO | Admitting: Family Medicine

## 2011-06-13 VITALS — BP 128/83 | HR 64 | Temp 98.2°F | Resp 18 | Ht 68.0 in | Wt 196.0 lb

## 2011-06-13 DIAGNOSIS — L03019 Cellulitis of unspecified finger: Secondary | ICD-10-CM

## 2011-06-13 DIAGNOSIS — L02519 Cutaneous abscess of unspecified hand: Secondary | ICD-10-CM

## 2011-06-13 MED ORDER — DOXYCYCLINE HYCLATE 100 MG PO CAPS: 100.0000 mg | ORAL_CAPSULE | Freq: Two times a day (BID) | ORAL | Status: AC

## 2011-06-13 NOTE — Progress Notes (Signed)
  Patient Name: Shane Short Date of Birth: 04/16/1950 Medical Record Number: [id number] Gender: male Date of Encounter: 06/13/2011  History of Present Illness:  Shane Short is a 61 y.o. very pleasant male patient who presents with the following:  Doing some yard work over the weekend- reached into a flower pot to pull out some weeds.  The next day he noted a pustle on his thumb.  The pustule was located near the MCP joint and he attempted to drain it with a needle at home- only watery fluid came out.  He feels fine otherwise and the wound is not overly painful- he is just worried that it may represent a spider bite or that it could be infected  TDaP 6/12  Patient Active Problem List  Diagnoses  . HYPOTHYROIDISM  . VITAMIN B12 DEFICIENCY  . OBSTRUCTIVE SLEEP APNEA  . HYPERTENSION  . SHOULDER PAIN  . PARESTHESIA  . ACTINIC KERATOSIS  . Well adult exam   Past Medical History  Diagnosis Date  . Hypertension   . Thyroid disease     hypo  . OSA on CPAP   . Dyslipidemia (high LDL; low HDL)     low hdl  . Vitamin B12 deficiency 2011   Past Surgical History  Procedure Date  . Lower venous doppler 05-02-2006   History  Substance Use Topics  . Smoking status: Former Games developer  . Smokeless tobacco: Not on file   Comment: quit 30 years ago  . Alcohol Use: No   Family History  Problem Relation Age of Onset  . Hypertension Other   . Mental illness Mother     dementia  . COPD Father   . Cancer Father     lung ca  . Stroke Father     brain aneurism   No Known Allergies  Medication list has been reviewed and updated.  Review of Systems: As per HPI- otherwise negative. Otherwise feels well, no fever or other symptoms  Physical Examination: Filed Vitals:   06/13/11 1425  BP: 128/83  Pulse: 64  Temp: 98.2 F (36.8 C)  TempSrc: Oral  Resp: 18  Height: 5\' 8"  (1.727 m)  Weight: 196 lb (88.905 kg)    Body mass index is 29.80 kg/(m^2).  GEN: WDWN, NAD,  Non-toxic, A & O x 3 HEENT: Atraumatic, Normocephalic. Neck supple. No masses, No LAD. Ears and Nose: No external deformity. CV: RRR, No M/G/R. No JVD. No thrill. No extra heart sounds. PULM: CTA B, no wheezes, crackles, rhonchi. No retractions. No resp. distress. No accessory muscle use. EXTR: No c/c/e NEURO Normal gait.  PSYCH: Normally interactive. Conversant. Not depressed or anxious appearing.  Calm demeanor.  Left thumb: at MCP joint there is a small wound which drains some clear fluid- he has attempted to I and D at home with a needle.  The MCP joint itself is mobile without any tenderness or redness, no swelling or heat in the joint or thumb/ hand  Assessment and Plan: 1. Cellulitis, finger  doxycycline (VIBRAMYCIN) 100 MG capsule, Wound culture   Cellulitis near 1st MCP joint- will cover with doxycycline as above, continue to keep wound clean and monitor progress.  If any worsening or if not getting better in the next few days please let me know- sooner if getting worse!

## 2011-06-16 LAB — WOUND CULTURE: Gram Stain: NONE SEEN

## 2011-08-24 ENCOUNTER — Other Ambulatory Visit (INDEPENDENT_AMBULATORY_CARE_PROVIDER_SITE_OTHER): Payer: BC Managed Care – PPO

## 2011-08-24 DIAGNOSIS — Z0000 Encounter for general adult medical examination without abnormal findings: Secondary | ICD-10-CM

## 2011-08-24 DIAGNOSIS — E039 Hypothyroidism, unspecified: Secondary | ICD-10-CM

## 2011-08-24 DIAGNOSIS — Z0389 Encounter for observation for other suspected diseases and conditions ruled out: Secondary | ICD-10-CM

## 2011-08-24 DIAGNOSIS — E538 Deficiency of other specified B group vitamins: Secondary | ICD-10-CM

## 2011-08-24 LAB — PSA: PSA: 0.27 ng/mL (ref 0.10–4.00)

## 2011-08-24 LAB — URINALYSIS, ROUTINE W REFLEX MICROSCOPIC
Ketones, ur: NEGATIVE
Leukocytes, UA: NEGATIVE
Specific Gravity, Urine: 1.025 (ref 1.000–1.030)
Total Protein, Urine: NEGATIVE
pH: 6 (ref 5.0–8.0)

## 2011-08-24 LAB — BASIC METABOLIC PANEL
BUN: 18 mg/dL (ref 6–23)
CO2: 30 mEq/L (ref 19–32)
Chloride: 105 mEq/L (ref 96–112)
Creatinine, Ser: 1 mg/dL (ref 0.4–1.5)
Glucose, Bld: 92 mg/dL (ref 70–99)

## 2011-08-24 LAB — TSH: TSH: 3.43 u[IU]/mL (ref 0.35–5.50)

## 2011-08-24 LAB — HEPATIC FUNCTION PANEL
ALT: 33 U/L (ref 0–53)
Albumin: 3.8 g/dL (ref 3.5–5.2)
Bilirubin, Direct: 0.1 mg/dL (ref 0.0–0.3)
Total Protein: 7.3 g/dL (ref 6.0–8.3)

## 2011-08-24 LAB — CBC WITH DIFFERENTIAL/PLATELET
Basophils Relative: 0.6 % (ref 0.0–3.0)
Eosinophils Relative: 7.8 % — ABNORMAL HIGH (ref 0.0–5.0)
HCT: 41.4 % (ref 39.0–52.0)
Hemoglobin: 13.8 g/dL (ref 13.0–17.0)
Lymphocytes Relative: 34.5 % (ref 12.0–46.0)
Lymphs Abs: 1.9 10*3/uL (ref 0.7–4.0)
Monocytes Relative: 10.7 % (ref 3.0–12.0)
Neutro Abs: 2.5 10*3/uL (ref 1.4–7.7)
RBC: 4.74 Mil/uL (ref 4.22–5.81)

## 2011-08-24 LAB — LIPID PANEL
Cholesterol: 170 mg/dL (ref 0–200)
Total CHOL/HDL Ratio: 5

## 2011-08-29 ENCOUNTER — Encounter: Payer: Self-pay | Admitting: Internal Medicine

## 2011-08-29 ENCOUNTER — Ambulatory Visit (INDEPENDENT_AMBULATORY_CARE_PROVIDER_SITE_OTHER): Payer: BC Managed Care – PPO | Admitting: Internal Medicine

## 2011-08-29 VITALS — BP 138/92 | HR 72 | Temp 98.5°F | Resp 16 | Ht 69.0 in | Wt 195.0 lb

## 2011-08-29 DIAGNOSIS — Z0000 Encounter for general adult medical examination without abnormal findings: Secondary | ICD-10-CM

## 2011-08-29 DIAGNOSIS — I10 Essential (primary) hypertension: Secondary | ICD-10-CM

## 2011-08-29 DIAGNOSIS — E538 Deficiency of other specified B group vitamins: Secondary | ICD-10-CM

## 2011-08-29 NOTE — Assessment & Plan Note (Signed)
Continue with current prescription therapy as reflected on the Med list.  

## 2011-08-29 NOTE — Assessment & Plan Note (Addendum)
We discussed age appropriate health related issues, including available/recomended screening tests and vaccinations. We discussed a need for adhering to healthy diet and exercise. Labs/EKG were reviewed/ordered. All questions were answered.  Zostavax adviced 

## 2011-08-29 NOTE — Progress Notes (Signed)
Patient ID: Shane Short, male   DOB: 05/07/1950, 61 y.o.   MRN: [id number]  Subjective:    Patient ID: Shane Short, male    DOB: 21-Mar-1950, 61 y.o.   MRN: [id number]  HPI  The patient is here for a wellness exam. The patient has been doing well overall without major physical or psychological issues going on lately.     Review of Systems  Constitutional: Negative for appetite change, fatigue and unexpected weight change.  HENT: Negative for nosebleeds, congestion, sore throat, sneezing, trouble swallowing and neck pain.   Eyes: Negative for itching and visual disturbance.  Respiratory: Negative for cough.   Cardiovascular: Negative for chest pain, palpitations and leg swelling.  Gastrointestinal: Negative for nausea, diarrhea, blood in stool and abdominal distention.  Genitourinary: Negative for frequency and hematuria.  Musculoskeletal: Negative for back pain, joint swelling and gait problem.  Skin: Negative for rash.  Neurological: Negative for dizziness, tremors, speech difficulty and weakness.  Psychiatric/Behavioral: Negative for disturbed wake/sleep cycle, dysphoric mood and agitation. The patient is not nervous/anxious.    Wt Readings from Last 3 Encounters:  08/29/11 195 lb (88.451 kg)  06/13/11 196 lb (88.905 kg)  08/27/10 198 lb (89.812 kg)   BP Readings from Last 3 Encounters:  08/29/11 138/92  06/13/11 128/83  08/27/10 116/72         Objective:   Physical Exam  Constitutional: He is oriented to person, place, and time. He appears well-developed and well-nourished. No distress.  HENT:  Head: Normocephalic and atraumatic.  Right Ear: External ear normal.  Left Ear: External ear normal.  Nose: Nose normal.  Mouth/Throat: Oropharynx is clear and moist. No oropharyngeal exudate.  Eyes: Conjunctivae and EOM are normal. Pupils are equal, round, and reactive to light. Right eye exhibits no discharge. Left eye exhibits no discharge. No scleral icterus.  Neck:  Normal range of motion. Neck supple. No JVD present. No tracheal deviation present. No thyromegaly present.  Cardiovascular: Normal rate, regular rhythm, normal heart sounds and intact distal pulses.  Exam reveals no gallop and no friction rub.   No murmur heard. Pulmonary/Chest: Effort normal and breath sounds normal. No stridor. No respiratory distress. He has no wheezes. He has no rales. He exhibits no tenderness.  Abdominal: Soft. Bowel sounds are normal. He exhibits no distension and no mass. There is no tenderness. There is no rebound and no guarding.  Genitourinary: Rectum normal, prostate normal and penis normal. Guaiac negative stool. No penile tenderness.  Musculoskeletal: Normal range of motion. He exhibits no edema and no tenderness.  Lymphadenopathy:    He has no cervical adenopathy.  Neurological: He is alert and oriented to person, place, and time. He has normal reflexes. No cranial nerve deficit. He exhibits normal muscle tone. Coordination normal.  Skin: Skin is warm and dry. No rash noted. He is not diaphoretic. No erythema. No pallor.  Psychiatric: He has a normal mood and affect. His behavior is normal. Judgment and thought content normal.       Lab Results  Component Value Date   WBC 5.4 08/24/2011   HGB 13.8 08/24/2011   HCT 41.4 08/24/2011   PLT 212.0 08/24/2011   CHOL 170 08/24/2011   TRIG 202.0* 08/24/2011   HDL 33.00* 08/24/2011   ALT 33 08/24/2011   AST 30 08/24/2011   NA 141 08/24/2011   K 4.9 08/24/2011   CL 105 08/24/2011   CREATININE 1.0 08/24/2011   BUN 18 08/24/2011   CO2 30  08/24/2011   TSH 3.43 08/24/2011   PSA 0.27 08/24/2011      Assessment & Plan:

## 2011-09-19 ENCOUNTER — Telehealth: Payer: Self-pay | Admitting: *Deleted

## 2011-09-19 NOTE — Telephone Encounter (Signed)
Error. CPE labs prev entered.

## 2011-09-19 NOTE — Telephone Encounter (Signed)
Message copied by Merrilyn Puma on Mon Sep 19, 2011  9:52 AM ------      Message from: COUSIN, SHARON T      Created: Mon Aug 29, 2011  4:04 PM      Regarding: PHY DATE   08/29/12       THANKS

## 2011-11-16 ENCOUNTER — Ambulatory Visit (INDEPENDENT_AMBULATORY_CARE_PROVIDER_SITE_OTHER): Payer: BC Managed Care – PPO

## 2011-11-16 DIAGNOSIS — Z23 Encounter for immunization: Secondary | ICD-10-CM

## 2011-11-22 ENCOUNTER — Other Ambulatory Visit: Payer: Self-pay | Admitting: Internal Medicine

## 2012-02-18 ENCOUNTER — Other Ambulatory Visit: Payer: Self-pay | Admitting: Internal Medicine

## 2012-03-05 ENCOUNTER — Emergency Department (HOSPITAL_COMMUNITY): Payer: Managed Care, Other (non HMO)

## 2012-03-05 ENCOUNTER — Emergency Department (HOSPITAL_COMMUNITY)
Admission: EM | Admit: 2012-03-05 | Discharge: 2012-03-05 | Disposition: A | Discharge status: Home / Self Care | Payer: Managed Care, Other (non HMO) | Attending: Emergency Medicine | Admitting: Emergency Medicine

## 2012-03-05 ENCOUNTER — Encounter (HOSPITAL_COMMUNITY): Payer: Self-pay | Admitting: Emergency Medicine

## 2012-03-05 DIAGNOSIS — R0602 Shortness of breath: Secondary | ICD-10-CM | POA: Insufficient documentation

## 2012-03-05 DIAGNOSIS — Z7982 Long term (current) use of aspirin: Secondary | ICD-10-CM | POA: Insufficient documentation

## 2012-03-05 DIAGNOSIS — R05 Cough: Secondary | ICD-10-CM | POA: Insufficient documentation

## 2012-03-05 DIAGNOSIS — E538 Deficiency of other specified B group vitamins: Secondary | ICD-10-CM | POA: Insufficient documentation

## 2012-03-05 DIAGNOSIS — G4733 Obstructive sleep apnea (adult) (pediatric): Secondary | ICD-10-CM | POA: Insufficient documentation

## 2012-03-05 DIAGNOSIS — E039 Hypothyroidism, unspecified: Secondary | ICD-10-CM | POA: Insufficient documentation

## 2012-03-05 DIAGNOSIS — R079 Chest pain, unspecified: Secondary | ICD-10-CM

## 2012-03-05 DIAGNOSIS — J40 Bronchitis, not specified as acute or chronic: Secondary | ICD-10-CM | POA: Insufficient documentation

## 2012-03-05 DIAGNOSIS — Z87891 Personal history of nicotine dependence: Secondary | ICD-10-CM | POA: Insufficient documentation

## 2012-03-05 DIAGNOSIS — I10 Essential (primary) hypertension: Secondary | ICD-10-CM | POA: Insufficient documentation

## 2012-03-05 DIAGNOSIS — J3489 Other specified disorders of nose and nasal sinuses: Secondary | ICD-10-CM | POA: Insufficient documentation

## 2012-03-05 DIAGNOSIS — R0789 Other chest pain: Secondary | ICD-10-CM | POA: Insufficient documentation

## 2012-03-05 DIAGNOSIS — R059 Cough, unspecified: Secondary | ICD-10-CM | POA: Insufficient documentation

## 2012-03-05 DIAGNOSIS — E785 Hyperlipidemia, unspecified: Secondary | ICD-10-CM | POA: Insufficient documentation

## 2012-03-05 DIAGNOSIS — Z9981 Dependence on supplemental oxygen: Secondary | ICD-10-CM | POA: Insufficient documentation

## 2012-03-05 DIAGNOSIS — Z79899 Other long term (current) drug therapy: Secondary | ICD-10-CM | POA: Insufficient documentation

## 2012-03-05 LAB — CBC WITH DIFFERENTIAL/PLATELET
Basophils Absolute: 0 10*3/uL (ref 0.0–0.1)
Basophils Relative: 1 % (ref 0–1)
Eosinophils Absolute: 0.4 10*3/uL (ref 0.0–0.7)
Eosinophils Relative: 7 % — ABNORMAL HIGH (ref 0–5)
Lymphocytes Relative: 31 % (ref 12–46)
MCV: 84.5 fL (ref 78.0–100.0)
Platelets: 176 10*3/uL (ref 150–400)
RDW: 12.4 % (ref 11.5–15.5)
WBC: 6.3 10*3/uL (ref 4.0–10.5)

## 2012-03-05 LAB — BASIC METABOLIC PANEL
CO2: 23 mEq/L (ref 19–32)
Calcium: 9.2 mg/dL (ref 8.4–10.5)
GFR calc Af Amer: >90 mL/min (ref >90)
GFR calc non Af Amer: >90 mL/min (ref >90)
Sodium: 136 mEq/L (ref 135–145)

## 2012-03-05 LAB — PRO B NATRIURETIC PEPTIDE: Pro B Natriuretic peptide (BNP): 12.5 pg/mL (ref 0–125)

## 2012-03-05 LAB — TROPONIN I: Troponin I: <0.3 ng/mL (ref <0.30)

## 2012-03-05 MED ORDER — IPRATROPIUM BROMIDE 0.02 % IN SOLN
0.5000 mg | Freq: Once | RESPIRATORY_TRACT | Status: AC
  Administered 2012-03-05: 0.5 mg via RESPIRATORY_TRACT
  Filled 2012-03-05: qty 2.5

## 2012-03-05 MED ORDER — ALBUTEROL SULFATE (5 MG/ML) 0.5% IN NEBU
5.0000 mg | INHALATION_SOLUTION | Freq: Once | RESPIRATORY_TRACT | Status: AC
  Administered 2012-03-05: 5 mg via RESPIRATORY_TRACT
  Filled 2012-03-05: qty 1

## 2012-03-05 MED ORDER — IBUPROFEN 800 MG PO TABS
800.0000 mg | ORAL_TABLET | Freq: Once | ORAL | Status: AC
  Administered 2012-03-05: 800 mg via ORAL
  Filled 2012-03-05: qty 1

## 2012-03-05 MED ORDER — ALBUTEROL SULFATE HFA 108 (90 BASE) MCG/ACT IN AERS
2.0000 | INHALATION_SPRAY | RESPIRATORY_TRACT | Status: DC | PRN
  Administered 2012-03-05: 2 via RESPIRATORY_TRACT
  Filled 2012-03-05: qty 6.7

## 2012-03-05 NOTE — ED Provider Notes (Signed)
Medical screening examination/treatment/procedure(s) were performed by non-physician practitioner and as supervising physician I was immediately available for consultation/collaboration.   Lyanne Co, MD 03/05/12 [phone]

## 2012-03-05 NOTE — ED Notes (Signed)
Pt states he started having chest pain about 2 hrs ago  States the pain is a dull ache in the center of his chest  Pt states it is hard to breathe when he lays down  Pt states he has had cold sxs that just started lately so he took some cold medication and a breathing tx earlier  Pt states he felt a little light headed too but denies any other sxs

## 2012-03-05 NOTE — ED Provider Notes (Signed)
History     CSN: [id number]  Arrival date & time 03/05/12  0104   First MD Initiated Contact with Patient 03/05/12 0141      Chief Complaint  Patient presents with  . Chest Pain   HPI  History provided by the patient. Patient is a 62 year old male with history of hypertension, dyslipidemia, hypothyroidism who presents with complaints of chest pain and pressure. Patient states symptoms first began with cough, congestion and rhinorrhea 2 days ago. Coughing has increased and early this morning patient did use over-the-counter cough medicines. This seemed to help some with his rhinorrhea and coughing. This evening however patient felt a heaviness and pressure over the center of his chest while trying to lie down to sleep. Symptoms are improved slightly with standing and sitting upright. Symptoms continued and every time he tried to lay flat. He denies having significant shortness of breath. Denies any dizziness, lightheadedness, diaphoresis or nausea. He denies any sharp or pleuritic pains. Denies any hemoptysis. Denies fevers, chills or sweats.    Past Medical History  Diagnosis Date  . Hypertension   . Thyroid disease     hypo  . OSA on CPAP   . Dyslipidemia (high LDL; low HDL)     low hdl  . Vitamin B12 deficiency 2011    Past Surgical History  Procedure Date  . Lower venous doppler 05-02-2006  . Hernia repair     Family History  Problem Relation Age of Onset  . Hypertension Other   . Mental illness Mother     dementia  . COPD Father   . Cancer Father     lung ca  . Stroke Father     brain aneurism    History  Substance Use Topics  . Smoking status: Former Games developer  . Smokeless tobacco: Not on file     Comment: quit 30 years ago  . Alcohol Use: No      Review of Systems  Constitutional: Negative for fever, chills and diaphoresis.  HENT: Positive for congestion.   Respiratory: Positive for cough and shortness of breath.   Cardiovascular: Positive for chest  pain. Negative for palpitations and leg swelling.  Gastrointestinal: Negative for nausea, vomiting and diarrhea.  All other systems reviewed and are negative.    Allergies  Review of patient's allergies indicates no known allergies.  Home Medications   Current Outpatient Rx  Name  Route  Sig  Dispense  Refill  . ASPIRIN 81 MG PO TBEC   Oral   Take 81 mg by mouth daily after breakfast.           . VITAMIN D3 1000 UNITS PO TABS   Oral   Take 1,000 Units by mouth daily.           Marland Kitchen LEVOTHYROXINE SODIUM 100 MCG PO TABS      TAKE ONE TABLET BY MOUTH EVERY DAY   30 tablet   4   . METOPROLOL SUCCINATE ER 25 MG PO TB24      TAKE ONE TABLET BY MOUTH EVERY DAY   30 tablet   4   . CYANOCOBALAMIN 1000 MCG/ML IJ SOLN      INJECT 1 MILLILITER SUBCUTANEOUSLY DAILY FOR 5 DAYS, THEN ONE WEEKLY FOR 4 WEEKS, THEN ONE EVERY 2 WEEKS   30 mL   1     BP 135/88  Pulse 66  Temp 97.9 F (36.6 C) (Oral)  Resp 20  SpO2 97%  Physical Exam  Nursing note  and vitals reviewed. Constitutional: He is oriented to person, place, and time. He appears well-developed and well-nourished. No distress.  HENT:  Head: Normocephalic.  Neck: Normal range of motion. Neck supple.  Cardiovascular: Normal rate and regular rhythm.   No murmur heard. Pulmonary/Chest: Effort normal and breath sounds normal. No respiratory distress. He has no wheezes. He has no rales. He exhibits no tenderness.  Abdominal: There is no tenderness. There is no rebound and no guarding.  Musculoskeletal: He exhibits no edema and no tenderness.  Neurological: He is alert and oriented to person, place, and time.  Skin: Skin is warm.  Psychiatric: He has a normal mood and affect. His behavior is normal.    ED Course  Procedures  Results for orders placed during the hospital encounter of 03/05/12  CBC WITH DIFFERENTIAL      Component Value Range   WBC 6.3  4.0 - 10.5 K/uL   RBC 4.59  4.22 - 5.81 MIL/uL   Hemoglobin 13.4   13.0 - 17.0 g/dL   HCT 16.1 (*) 09.6 - 04.5 %   MCV 84.5  78.0 - 100.0 fL   MCH 29.2  26.0 - 34.0 pg   MCHC 34.5  30.0 - 36.0 g/dL   RDW 40.9  81.1 - 91.4 %   Platelets 176  150 - 400 K/uL   Neutrophils Relative 52  43 - 77 %   Neutro Abs 3.3  1.7 - 7.7 K/uL   Lymphocytes Relative 31  12 - 46 %   Lymphs Abs 2.0  0.7 - 4.0 K/uL   Monocytes Relative 9  3 - 12 %   Monocytes Absolute 0.6  0.1 - 1.0 K/uL   Eosinophils Relative 7 (*) 0 - 5 %   Eosinophils Absolute 0.4  0.0 - 0.7 K/uL   Basophils Relative 1  0 - 1 %   Basophils Absolute 0.0  0.0 - 0.1 K/uL  BASIC METABOLIC PANEL      Component Value Range   Sodium 136  135 - 145 mEq/L   Potassium 3.9  3.5 - 5.1 mEq/L   Chloride 102  96 - 112 mEq/L   CO2 23  19 - 32 mEq/L   Glucose, Bld 112 (*) 70 - 99 mg/dL   BUN 12  6 - 23 mg/dL   Creatinine, Ser 7.82  0.50 - 1.35 mg/dL   Calcium 9.2  8.4 - 95.6 mg/dL   GFR calc non Af Amer >90  >90 mL/min   GFR calc Af Amer >90  >90 mL/min  TROPONIN I      Component Value Range   Troponin I <0.30  <0.30 ng/mL  PRO B NATRIURETIC PEPTIDE      Component Value Range   Pro B Natriuretic peptide (BNP) 12.5  0 - 125 pg/mL       Dg Chest Port 1 View  03/05/2012  *RADIOLOGY REPORT*  Clinical Data: Acute onset chest pain.  PORTABLE CHEST - 1 VIEW  Comparison: None.  Findings: Lungs clear.  Heart size normal.  No pneumothorax or pleural fluid.  IMPRESSION: No acute disease.   Original Report Authenticated By: Holley Dexter, M.D.      1. Chest pain   2. Cough   3. Bronchitis       MDM  Patient seen and evaluated. Patient resting comfortably and well-appearing at this time. Slight wheezing on lung exam. Will give breathing treatment.   Lab tests, chest x-ray, troponin x 2 and EKG unremarkable.  Pt discussed with Attending Physician.  At this do not suspect ACS.  Pt has good follow up.  At this time felt he may return home and follow up with PCP and cardiologist.  Given pt's wheezing suspect  bronchitis and have provided inhaler for treatment of symptoms.       Date: 03/05/2012  Rate: 56  Rhythm: sinus bradycardia  QRS Axis: normal  Intervals: normal  ST/T Wave abnormalities: normal  Conduction Disutrbances:none  Narrative Interpretation:   Old EKG Reviewed: No significant change    Angus Seller, [place] 03/05/12 [phone]

## 2012-04-17 ENCOUNTER — Other Ambulatory Visit: Payer: Self-pay | Admitting: Internal Medicine

## 2012-08-21 ENCOUNTER — Telehealth: Payer: Self-pay

## 2012-08-21 ENCOUNTER — Other Ambulatory Visit (INDEPENDENT_AMBULATORY_CARE_PROVIDER_SITE_OTHER): Payer: Managed Care, Other (non HMO)

## 2012-08-21 DIAGNOSIS — Z0000 Encounter for general adult medical examination without abnormal findings: Secondary | ICD-10-CM

## 2012-08-21 DIAGNOSIS — Z0389 Encounter for observation for other suspected diseases and conditions ruled out: Secondary | ICD-10-CM

## 2012-08-21 DIAGNOSIS — E039 Hypothyroidism, unspecified: Secondary | ICD-10-CM

## 2012-08-21 LAB — LIPID PANEL
HDL: 37.4 mg/dL — ABNORMAL LOW (ref >39.00)
LDL Cholesterol: 119 mg/dL — ABNORMAL HIGH (ref 0–99)
VLDL: 16.8 mg/dL (ref 0.0–40.0)

## 2012-08-21 LAB — URINALYSIS
Bilirubin Urine: NEGATIVE
Hgb urine dipstick: NEGATIVE
Total Protein, Urine: NEGATIVE
Urine Glucose: NEGATIVE

## 2012-08-21 LAB — BASIC METABOLIC PANEL
CO2: 31 mEq/L (ref 19–32)
Calcium: 9.5 mg/dL (ref 8.4–10.5)
Chloride: 103 mEq/L (ref 96–112)
Sodium: 140 mEq/L (ref 135–145)

## 2012-08-21 LAB — CBC
HCT: 40.1 % (ref 39.0–52.0)
MCHC: 34.2 g/dL (ref 30.0–36.0)
RDW: 12.7 % (ref 11.5–14.6)
WBC: 5.9 10*3/uL (ref 4.5–10.5)

## 2012-08-21 LAB — TSH: TSH: 2.68 u[IU]/mL (ref 0.35–5.50)

## 2012-08-21 LAB — HEPATIC FUNCTION PANEL
Alkaline Phosphatase: 58 U/L (ref 39–117)
Bilirubin, Direct: 0.1 mg/dL (ref 0.0–0.3)
Total Bilirubin: 0.8 mg/dL (ref 0.3–1.2)
Total Protein: 7.8 g/dL (ref 6.0–8.3)

## 2012-08-21 NOTE — Telephone Encounter (Signed)
Order for labs put in

## 2012-08-29 ENCOUNTER — Encounter: Payer: Self-pay | Admitting: Internal Medicine

## 2012-08-29 ENCOUNTER — Ambulatory Visit (INDEPENDENT_AMBULATORY_CARE_PROVIDER_SITE_OTHER): Payer: Managed Care, Other (non HMO) | Admitting: Internal Medicine

## 2012-08-29 VITALS — BP 110/82 | HR 80 | Temp 98.2°F | Resp 16 | Ht 68.0 in | Wt 193.0 lb

## 2012-08-29 DIAGNOSIS — Z2911 Encounter for prophylactic immunotherapy for respiratory syncytial virus (RSV): Secondary | ICD-10-CM

## 2012-08-29 DIAGNOSIS — Z23 Encounter for immunization: Secondary | ICD-10-CM

## 2012-08-29 DIAGNOSIS — E538 Deficiency of other specified B group vitamins: Secondary | ICD-10-CM

## 2012-08-29 DIAGNOSIS — I10 Essential (primary) hypertension: Secondary | ICD-10-CM

## 2012-08-29 DIAGNOSIS — Z0000 Encounter for general adult medical examination without abnormal findings: Secondary | ICD-10-CM

## 2012-08-29 NOTE — Assessment & Plan Note (Addendum)
  We discussed age appropriate health related issues, including available/recomended screening tests and vaccinations. We discussed a need for adhering to healthy diet and exercise. Labs/EKG were reviewed/ordered. All questions were answered. Zostavax 

## 2012-08-29 NOTE — Assessment & Plan Note (Signed)
Continue with current prescription therapy as reflected on the Med list. Elev K - likely an artifact

## 2012-08-29 NOTE — Progress Notes (Signed)
   Subjective:    HPI  The patient is here for a wellness exam.   The patient has been doing well overall without major physical or psychological issues going on lately.     Review of Systems  Constitutional: Negative for appetite change, fatigue and unexpected weight change.  HENT: Negative for nosebleeds, congestion, sore throat, sneezing, trouble swallowing and neck pain.   Eyes: Negative for itching and visual disturbance.  Respiratory: Negative for cough.   Cardiovascular: Negative for chest pain, palpitations and leg swelling.  Gastrointestinal: Negative for nausea, diarrhea, blood in stool and abdominal distention.  Genitourinary: Negative for frequency and hematuria.  Musculoskeletal: Negative for back pain, joint swelling and gait problem.  Skin: Negative for rash.  Neurological: Negative for dizziness, tremors, speech difficulty and weakness.  Psychiatric/Behavioral: Negative for sleep disturbance, dysphoric mood and agitation. The patient is not nervous/anxious.    Wt Readings from Last 3 Encounters:  08/29/12 193 lb (87.544 kg)  08/29/11 195 lb (88.451 kg)  06/13/11 196 lb (88.905 kg)   BP Readings from Last 3 Encounters:  08/29/12 110/82  03/05/12 119/78  08/29/11 138/92         Objective:   Physical Exam  Constitutional: He is oriented to person, place, and time. He appears well-developed and well-nourished. No distress.  HENT:  Head: Normocephalic and atraumatic.  Right Ear: External ear normal.  Left Ear: External ear normal.  Nose: Nose normal.  Mouth/Throat: Oropharynx is clear and moist. No oropharyngeal exudate.  Eyes: Conjunctivae and EOM are normal. Pupils are equal, round, and reactive to light. Right eye exhibits no discharge. Left eye exhibits no discharge. No scleral icterus.  Neck: Normal range of motion. Neck supple. No JVD present. No tracheal deviation present. No thyromegaly present.  Cardiovascular: Normal rate, regular rhythm, normal  heart sounds and intact distal pulses.  Exam reveals no gallop and no friction rub.   No murmur heard. Pulmonary/Chest: Effort normal and breath sounds normal. No stridor. No respiratory distress. He has no wheezes. He has no rales. He exhibits no tenderness.  Abdominal: Soft. Bowel sounds are normal. He exhibits no distension and no mass. There is no tenderness. There is no rebound and no guarding.  Genitourinary: Rectum normal, prostate normal and penis normal. Guaiac negative stool. No penile tenderness.  Musculoskeletal: Normal range of motion. He exhibits no edema and no tenderness.  Lymphadenopathy:    He has no cervical adenopathy.  Neurological: He is alert and oriented to person, place, and time. He has normal reflexes. No cranial nerve deficit. He exhibits normal muscle tone. Coordination normal.  Skin: Skin is warm and dry. No rash noted. He is not diaphoretic. No erythema. No pallor.  Psychiatric: He has a normal mood and affect. His behavior is normal. Judgment and thought content normal.       Lab Results  Component Value Date   WBC 5.9 08/21/2012   HGB 13.7 08/21/2012   HCT 40.1 08/21/2012   PLT 206.0 08/21/2012   CHOL 173 08/21/2012   TRIG 84.0 08/21/2012   HDL 37.40* 08/21/2012   ALT 22 08/21/2012   AST 25 08/21/2012   NA 140 08/21/2012   K 5.6* 08/21/2012   CL 103 08/21/2012   CREATININE 1.0 08/21/2012   BUN 21 08/21/2012   CO2 31 08/21/2012   TSH 2.68 08/21/2012   PSA 0.29 08/21/2012      Assessment & Plan:

## 2012-08-29 NOTE — Assessment & Plan Note (Signed)
Continue with current prescription therapy as reflected on the Med list.  

## 2012-10-15 ENCOUNTER — Other Ambulatory Visit: Payer: Self-pay | Admitting: Internal Medicine

## 2012-10-18 ENCOUNTER — Telehealth: Payer: Self-pay | Admitting: *Deleted

## 2012-10-18 DIAGNOSIS — Z0389 Encounter for observation for other suspected diseases and conditions ruled out: Secondary | ICD-10-CM

## 2012-10-18 DIAGNOSIS — Z0000 Encounter for general adult medical examination without abnormal findings: Secondary | ICD-10-CM

## 2012-10-18 NOTE — Telephone Encounter (Signed)
Message copied by Merrilyn Puma on Thu Oct 18, 2012  1:35 PM ------      Message from: Livingston Diones      Created: Wed Aug 29, 2012  3:46 PM       Pt has a CPE 09/02/13. Please enter lab order a week prior.  ------

## 2012-10-18 NOTE — Telephone Encounter (Signed)
CPE labs entered.  

## 2012-11-13 ENCOUNTER — Other Ambulatory Visit: Payer: Self-pay | Admitting: Internal Medicine

## 2012-11-13 ENCOUNTER — Ambulatory Visit (INDEPENDENT_AMBULATORY_CARE_PROVIDER_SITE_OTHER): Payer: Managed Care, Other (non HMO)

## 2012-11-13 DIAGNOSIS — Z23 Encounter for immunization: Secondary | ICD-10-CM

## 2012-12-17 ENCOUNTER — Other Ambulatory Visit: Payer: Self-pay | Admitting: Internal Medicine

## 2013-03-20 ENCOUNTER — Ambulatory Visit (INDEPENDENT_AMBULATORY_CARE_PROVIDER_SITE_OTHER): Payer: Managed Care, Other (non HMO) | Admitting: Internal Medicine

## 2013-03-20 ENCOUNTER — Encounter: Payer: Self-pay | Admitting: Internal Medicine

## 2013-03-20 VITALS — BP 110/80 | HR 68 | Temp 98.6°F | Resp 16 | Wt 194.0 lb

## 2013-03-20 DIAGNOSIS — R059 Cough, unspecified: Secondary | ICD-10-CM

## 2013-03-20 DIAGNOSIS — R05 Cough: Secondary | ICD-10-CM

## 2013-03-20 DIAGNOSIS — J189 Pneumonia, unspecified organism: Secondary | ICD-10-CM

## 2013-03-20 MED ORDER — PROMETHAZINE-CODEINE 6.25-10 MG/5ML PO SYRP: 5.0000 mL | ORAL_SOLUTION | ORAL | Status: DC | PRN

## 2013-03-20 MED ORDER — LEVOFLOXACIN 500 MG PO TABS: 500.0000 mg | ORAL_TABLET | Freq: Every day | ORAL | Status: DC

## 2013-03-20 NOTE — Progress Notes (Signed)
Pre visit review using our clinic review tool, if applicable. No additional management support is needed unless otherwise documented below in the visit note. 

## 2013-03-20 NOTE — Progress Notes (Signed)
   Subjective:    URI  This is a new problem. The current episode started 1 to 4 weeks ago. The problem has been gradually worsening. There has been no fever. Associated symptoms include chest pain, coughing and headaches. Pertinent negatives include no congestion, diarrhea, nausea, neck pain, plugged ear sensation, rash, sneezing or sore throat. He has tried acetaminophen for the symptoms. The treatment provided no relief.   Yellow sputum       Review of Systems  Constitutional: Negative for appetite change, fatigue and unexpected weight change.  HENT: Negative for congestion, nosebleeds, sneezing, sore throat and trouble swallowing.   Eyes: Negative for itching and visual disturbance.  Respiratory: Positive for cough.   Cardiovascular: Positive for chest pain. Negative for palpitations and leg swelling.  Gastrointestinal: Negative for nausea, diarrhea, blood in stool and abdominal distention.  Genitourinary: Negative for frequency and hematuria.  Musculoskeletal: Negative for back pain, gait problem, joint swelling and neck pain.  Skin: Negative for rash.  Neurological: Positive for headaches. Negative for dizziness, tremors, speech difficulty and weakness.  Psychiatric/Behavioral: Negative for sleep disturbance, dysphoric mood and agitation. The patient is not nervous/anxious.    Wt Readings from Last 3 Encounters:  03/20/13 194 lb (87.998 kg)  08/29/12 193 lb (87.544 kg)  08/29/11 195 lb (88.451 kg)   BP Readings from Last 3 Encounters:  03/20/13 110/80  08/29/12 110/82  03/05/12 119/78         Objective:   Physical Exam  Constitutional: He is oriented to person, place, and time. He appears well-developed and well-nourished. No distress.  HENT:  Head: Normocephalic and atraumatic.  Right Ear: External ear normal.  Left Ear: External ear normal.  Nose: Nose normal.  Mouth/Throat: Oropharynx is clear and moist. No oropharyngeal exudate.  Eyes: Conjunctivae and EOM  are normal. Pupils are equal, round, and reactive to light. Right eye exhibits no discharge. Left eye exhibits no discharge. No scleral icterus.  Neck: Normal range of motion. Neck supple. No JVD present. No tracheal deviation present. No thyromegaly present.  Cardiovascular: Normal rate, regular rhythm, normal heart sounds and intact distal pulses.  Exam reveals no gallop and no friction rub.   No murmur heard. Pulmonary/Chest: Effort normal and breath sounds normal. No stridor. No respiratory distress. He has no wheezes. He has no rales. He exhibits no tenderness.  Abdominal: Soft. Bowel sounds are normal. He exhibits no distension and no mass. There is no tenderness. There is no rebound and no guarding.  Genitourinary: Rectum normal, prostate normal and penis normal. Guaiac negative stool. No penile tenderness.  Musculoskeletal: Normal range of motion. He exhibits no edema and no tenderness.  Lymphadenopathy:    He has no cervical adenopathy.  Neurological: He is alert and oriented to person, place, and time. He has normal reflexes. No cranial nerve deficit. He exhibits normal muscle tone. Coordination normal.  Skin: Skin is warm and dry. No rash noted. He is not diaphoretic. No erythema. No pallor.  Psychiatric: He has a normal mood and affect. His behavior is normal. Judgment and thought content normal.     Subjective:

## 2013-03-20 NOTE — Assessment & Plan Note (Signed)
CXR Levaquin Prom-cod

## 2013-03-25 ENCOUNTER — Ambulatory Visit (INDEPENDENT_AMBULATORY_CARE_PROVIDER_SITE_OTHER)
Admission: RE | Admit: 2013-03-25 | Discharge: 2013-03-25 | Disposition: A | Discharge status: Home / Self Care | Payer: Managed Care, Other (non HMO) | Source: Ambulatory Visit | Attending: Internal Medicine | Admitting: Internal Medicine

## 2013-03-25 DIAGNOSIS — R059 Cough, unspecified: Secondary | ICD-10-CM

## 2013-03-25 DIAGNOSIS — R05 Cough: Secondary | ICD-10-CM

## 2013-03-26 ENCOUNTER — Encounter: Payer: Self-pay | Admitting: Internal Medicine

## 2013-06-11 ENCOUNTER — Other Ambulatory Visit: Payer: Self-pay | Admitting: Internal Medicine

## 2013-08-29 ENCOUNTER — Other Ambulatory Visit (INDEPENDENT_AMBULATORY_CARE_PROVIDER_SITE_OTHER): Payer: Managed Care, Other (non HMO)

## 2013-08-29 DIAGNOSIS — Z0000 Encounter for general adult medical examination without abnormal findings: Secondary | ICD-10-CM

## 2013-08-29 DIAGNOSIS — Z0389 Encounter for observation for other suspected diseases and conditions ruled out: Secondary | ICD-10-CM

## 2013-08-29 LAB — URINALYSIS, ROUTINE W REFLEX MICROSCOPIC
BILIRUBIN URINE: NEGATIVE
Hgb urine dipstick: NEGATIVE
Ketones, ur: NEGATIVE
Leukocytes, UA: NEGATIVE
Nitrite: NEGATIVE
Specific Gravity, Urine: 1.025 (ref 1.000–1.030)
TOTAL PROTEIN, URINE-UPE24: NEGATIVE
Urine Glucose: NEGATIVE
Urobilinogen, UA: 0.2 (ref 0.0–1.0)
pH: 6 (ref 5.0–8.0)

## 2013-08-29 LAB — CBC WITH DIFFERENTIAL/PLATELET
Basophils Absolute: 0 10*3/uL (ref 0.0–0.1)
Basophils Relative: 0.6 % (ref 0.0–3.0)
EOS PCT: 5.9 % — AB (ref 0.0–5.0)
Eosinophils Absolute: 0.3 10*3/uL (ref 0.0–0.7)
HEMATOCRIT: 42.1 % (ref 39.0–52.0)
Hemoglobin: 14.4 g/dL (ref 13.0–17.0)
LYMPHS ABS: 1.8 10*3/uL (ref 0.7–4.0)
Lymphocytes Relative: 32.7 % (ref 12.0–46.0)
MCHC: 34.1 g/dL (ref 30.0–36.0)
MCV: 86.9 fl (ref 78.0–100.0)
Monocytes Absolute: 0.5 10*3/uL (ref 0.1–1.0)
Monocytes Relative: 8.8 % (ref 3.0–12.0)
Neutro Abs: 2.9 10*3/uL (ref 1.4–7.7)
Neutrophils Relative %: 52 % (ref 43.0–77.0)
PLATELETS: 227 10*3/uL (ref 150.0–400.0)
RBC: 4.85 Mil/uL (ref 4.22–5.81)
RDW: 12.6 % (ref 11.5–15.5)
WBC: 5.6 10*3/uL (ref 4.0–10.5)

## 2013-08-29 LAB — HEPATIC FUNCTION PANEL
ALT: 23 U/L (ref 0–53)
AST: 21 U/L (ref 0–37)
Albumin: 4.3 g/dL (ref 3.5–5.2)
Alkaline Phosphatase: 67 U/L (ref 39–117)
Bilirubin, Direct: 0.1 mg/dL (ref 0.0–0.3)
Total Bilirubin: 0.7 mg/dL (ref 0.2–1.2)
Total Protein: 7.9 g/dL (ref 6.0–8.3)

## 2013-08-29 LAB — BASIC METABOLIC PANEL
BUN: 13 mg/dL (ref 6–23)
CO2: 32 mEq/L (ref 19–32)
Calcium: 10.1 mg/dL (ref 8.4–10.5)
Chloride: 103 mEq/L (ref 96–112)
Creatinine, Ser: 1 mg/dL (ref 0.4–1.5)
GFR: 78.65 mL/min (ref >60.00)
Glucose, Bld: 103 mg/dL — ABNORMAL HIGH (ref 70–99)
POTASSIUM: 4.5 meq/L (ref 3.5–5.1)
Sodium: 139 mEq/L (ref 135–145)

## 2013-08-29 LAB — LIPID PANEL
CHOL/HDL RATIO: 5
Cholesterol: 193 mg/dL (ref 0–200)
HDL: 37.2 mg/dL — ABNORMAL LOW (ref >39.00)
LDL CALC: 117 mg/dL — AB (ref 0–99)
NonHDL: 155.8
Triglycerides: 196 mg/dL — ABNORMAL HIGH (ref 0.0–149.0)
VLDL: 39.2 mg/dL (ref 0.0–40.0)

## 2013-08-29 LAB — PSA: PSA: 0.43 ng/mL (ref 0.10–4.00)

## 2013-08-29 LAB — TSH: TSH: 4.55 u[IU]/mL — ABNORMAL HIGH (ref 0.35–4.50)

## 2013-09-02 ENCOUNTER — Ambulatory Visit (INDEPENDENT_AMBULATORY_CARE_PROVIDER_SITE_OTHER): Payer: Managed Care, Other (non HMO) | Admitting: Internal Medicine

## 2013-09-02 ENCOUNTER — Encounter: Payer: Self-pay | Admitting: Internal Medicine

## 2013-09-02 VITALS — BP 138/90 | HR 68 | Temp 98.1°F | Resp 16 | Ht 69.0 in | Wt 200.0 lb

## 2013-09-02 DIAGNOSIS — M79602 Pain in left arm: Secondary | ICD-10-CM | POA: Insufficient documentation

## 2013-09-02 DIAGNOSIS — I10 Essential (primary) hypertension: Secondary | ICD-10-CM

## 2013-09-02 DIAGNOSIS — Z23 Encounter for immunization: Secondary | ICD-10-CM

## 2013-09-02 DIAGNOSIS — J0100 Acute maxillary sinusitis, unspecified: Secondary | ICD-10-CM

## 2013-09-02 DIAGNOSIS — E039 Hypothyroidism, unspecified: Secondary | ICD-10-CM

## 2013-09-02 DIAGNOSIS — J019 Acute sinusitis, unspecified: Secondary | ICD-10-CM | POA: Insufficient documentation

## 2013-09-02 DIAGNOSIS — E538 Deficiency of other specified B group vitamins: Secondary | ICD-10-CM

## 2013-09-02 DIAGNOSIS — Z0000 Encounter for general adult medical examination without abnormal findings: Secondary | ICD-10-CM

## 2013-09-02 DIAGNOSIS — E785 Hyperlipidemia, unspecified: Secondary | ICD-10-CM | POA: Insufficient documentation

## 2013-09-02 DIAGNOSIS — M79609 Pain in unspecified limb: Secondary | ICD-10-CM

## 2013-09-02 MED ORDER — METOPROLOL SUCCINATE ER 25 MG PO TB24: ORAL_TABLET | ORAL | Status: DC

## 2013-09-02 MED ORDER — AZITHROMYCIN 250 MG PO TABS: ORAL_TABLET | ORAL | Status: DC

## 2013-09-02 MED ORDER — NAPROXEN 500 MG PO TABS: 500.0000 mg | ORAL_TABLET | Freq: Two times a day (BID) | ORAL | Status: DC | PRN

## 2013-09-02 MED ORDER — LEVOTHYROXINE SODIUM 112 MCG PO TABS: 112.0000 ug | ORAL_TABLET | Freq: Every day | ORAL | Status: DC

## 2013-09-02 NOTE — Assessment & Plan Note (Addendum)
We increased Levoxyl to 112 mcg/d

## 2013-09-02 NOTE — Assessment & Plan Note (Signed)
Worse: correct thyroid Rx dose

## 2013-09-02 NOTE — Assessment & Plan Note (Signed)
We discussed age appropriate health related issues, including available/recomended screening tests and vaccinations. We discussed a need for adhering to healthy diet and exercise. Labs/EKG were reviewed/ordered. All questions were answered.   

## 2013-09-02 NOTE — Assessment & Plan Note (Signed)
Continue with current prescription therapy as reflected on the Med list.  

## 2013-09-02 NOTE — Addendum Note (Signed)
Addended by: Cresenciano Lick on: 09/02/2013 05:24 PM   Modules accepted: Orders

## 2013-09-02 NOTE — Assessment & Plan Note (Signed)
zpac if worse 

## 2013-09-02 NOTE — Progress Notes (Signed)
Pre visit review using our clinic review tool, if applicable. No additional management support is needed unless otherwise documented below in the visit note. 

## 2013-09-03 ENCOUNTER — Telehealth: Payer: Self-pay | Admitting: Internal Medicine

## 2013-09-03 NOTE — Telephone Encounter (Signed)
Relevant patient education assigned to patient using Emmi. ° °

## 2013-12-13 ENCOUNTER — Emergency Department (HOSPITAL_COMMUNITY): Payer: Managed Care, Other (non HMO)

## 2013-12-13 ENCOUNTER — Encounter (HOSPITAL_COMMUNITY): Payer: Self-pay | Admitting: Emergency Medicine

## 2013-12-13 ENCOUNTER — Emergency Department (HOSPITAL_COMMUNITY)
Admission: EM | Admit: 2013-12-13 | Discharge: 2013-12-13 | Disposition: A | Discharge status: Home / Self Care | Payer: Managed Care, Other (non HMO) | Attending: Emergency Medicine | Admitting: Emergency Medicine

## 2013-12-13 DIAGNOSIS — K921 Melena: Secondary | ICD-10-CM | POA: Insufficient documentation

## 2013-12-13 DIAGNOSIS — Z7982 Long term (current) use of aspirin: Secondary | ICD-10-CM | POA: Diagnosis not present

## 2013-12-13 DIAGNOSIS — R079 Chest pain, unspecified: Secondary | ICD-10-CM | POA: Diagnosis present

## 2013-12-13 DIAGNOSIS — G4733 Obstructive sleep apnea (adult) (pediatric): Secondary | ICD-10-CM | POA: Diagnosis not present

## 2013-12-13 DIAGNOSIS — Z87891 Personal history of nicotine dependence: Secondary | ICD-10-CM | POA: Diagnosis not present

## 2013-12-13 DIAGNOSIS — Z9981 Dependence on supplemental oxygen: Secondary | ICD-10-CM | POA: Insufficient documentation

## 2013-12-13 DIAGNOSIS — E039 Hypothyroidism, unspecified: Secondary | ICD-10-CM | POA: Diagnosis not present

## 2013-12-13 DIAGNOSIS — Z79899 Other long term (current) drug therapy: Secondary | ICD-10-CM | POA: Diagnosis not present

## 2013-12-13 DIAGNOSIS — D519 Vitamin B12 deficiency anemia, unspecified: Secondary | ICD-10-CM | POA: Insufficient documentation

## 2013-12-13 DIAGNOSIS — I10 Essential (primary) hypertension: Secondary | ICD-10-CM | POA: Insufficient documentation

## 2013-12-13 LAB — BASIC METABOLIC PANEL
Anion gap: 14 (ref 5–15)
BUN: 9 mg/dL (ref 6–23)
CHLORIDE: 106 meq/L (ref 96–112)
CO2: 24 meq/L (ref 19–32)
CREATININE: 1.02 mg/dL (ref 0.50–1.35)
Calcium: 9.2 mg/dL (ref 8.4–10.5)
GFR calc Af Amer: 89 mL/min — ABNORMAL LOW (ref >90)
GFR calc non Af Amer: 77 mL/min — ABNORMAL LOW (ref >90)
Glucose, Bld: 89 mg/dL (ref 70–99)
Potassium: 4.1 mEq/L (ref 3.7–5.3)
Sodium: 144 mEq/L (ref 137–147)

## 2013-12-13 LAB — CBC
HCT: 40 % (ref 39.0–52.0)
HEMOGLOBIN: 13.8 g/dL (ref 13.0–17.0)
MCH: 29.4 pg (ref 26.0–34.0)
MCHC: 34.5 g/dL (ref 30.0–36.0)
MCV: 85.1 fL (ref 78.0–100.0)
Platelets: 189 10*3/uL (ref 150–400)
RBC: 4.7 MIL/uL (ref 4.22–5.81)
RDW: 12.1 % (ref 11.5–15.5)
WBC: 5.8 10*3/uL (ref 4.0–10.5)

## 2013-12-13 LAB — I-STAT TROPONIN, ED
Troponin i, poc: 0 ng/mL (ref 0.00–0.08)
Troponin i, poc: 0 ng/mL (ref 0.00–0.08)

## 2013-12-13 LAB — PRO B NATRIURETIC PEPTIDE: Pro B Natriuretic peptide (BNP): 85.3 pg/mL (ref 0–125)

## 2013-12-13 MED ORDER — IOHEXOL 350 MG/ML SOLN
100.0000 mL | Freq: Once | INTRAVENOUS | Status: AC | PRN
  Administered 2013-12-13: 100 mL via INTRAVENOUS

## 2013-12-13 MED ORDER — IBUPROFEN 800 MG PO TABS: 800.0000 mg | ORAL_TABLET | Freq: Three times a day (TID) | ORAL | Status: DC

## 2013-12-13 MED ORDER — NITROGLYCERIN 0.4 MG SL SUBL
0.4000 mg | SUBLINGUAL_TABLET | SUBLINGUAL | Status: DC | PRN
  Administered 2013-12-13: 0.4 mg via SUBLINGUAL
  Filled 2013-12-13: qty 1

## 2013-12-13 MED ORDER — ASPIRIN 81 MG PO CHEW
324.0000 mg | CHEWABLE_TABLET | Freq: Once | ORAL | Status: AC
  Administered 2013-12-13: 324 mg via ORAL
  Filled 2013-12-13: qty 4

## 2013-12-13 NOTE — ED Notes (Signed)
Pt reports he has also had one episode of rectal bleeding this am

## 2013-12-13 NOTE — Discharge Instructions (Signed)
Chest Pain (Nonspecific) °It is often hard to give a specific diagnosis for the cause of chest pain. There is always a chance that your pain could be related to something serious, such as a heart attack or a blood clot in the lungs. You need to follow up with your health care provider for further evaluation. °CAUSES  °· Heartburn. °· Pneumonia or bronchitis. °· Anxiety or stress. °· Inflammation around your heart (pericarditis) or lung (pleuritis or pleurisy). °· A blood clot in the lung. °· A collapsed lung (pneumothorax). It can develop suddenly on its own (spontaneous pneumothorax) or from trauma to the chest. °· Shingles infection (herpes zoster virus). °The chest wall is composed of bones, muscles, and cartilage. Any of these can be the source of the pain. °· The bones can be bruised by injury. °· The muscles or cartilage can be strained by coughing or overwork. °· The cartilage can be affected by inflammation and become sore (costochondritis). °DIAGNOSIS  °Lab tests or other studies may be needed to find the cause of your pain. Your health care provider may have you take a test called an ambulatory electrocardiogram (ECG). An ECG records your heartbeat patterns over a 24-hour period. You may also have other tests, such as: °· Transthoracic echocardiogram (TTE). During echocardiography, sound waves are used to evaluate how blood flows through your heart. °· Transesophageal echocardiogram (TEE). °· Cardiac monitoring. This allows your health care provider to monitor your heart rate and rhythm in real time. °· Holter monitor. This is a portable device that records your heartbeat and can help diagnose heart arrhythmias. It allows your health care provider to track your heart activity for several days, if needed. °· Stress tests by exercise or by giving medicine that makes the heart beat faster. °TREATMENT  °· Treatment depends on what may be causing your chest pain. Treatment may include: °¨ Acid blockers for  heartburn. °¨ Anti-inflammatory medicine. °¨ Pain medicine for inflammatory conditions. °¨ Antibiotics if an infection is present. °· You may be advised to change lifestyle habits. This includes stopping smoking and avoiding alcohol, caffeine, and chocolate. °· You may be advised to keep your head raised (elevated) when sleeping. This reduces the chance of acid going backward from your stomach into your esophagus. °Most of the time, nonspecific chest pain will improve within 2-3 days with rest and mild pain medicine.  °HOME CARE INSTRUCTIONS  °· If antibiotics were prescribed, take them as directed. Finish them even if you start to feel better. °· For the next few days, avoid physical activities that bring on chest pain. Continue physical activities as directed. °· Do not use any tobacco products, including cigarettes, chewing tobacco, or electronic cigarettes. °· Avoid drinking alcohol. °· Only take medicine as directed by your health care provider. °· Follow your health care provider's suggestions for further testing if your chest pain does not go away. °· Keep any follow-up appointments you made. If you do not go to an appointment, you could develop lasting (chronic) problems with pain. If there is any problem keeping an appointment, call to reschedule. °SEEK MEDICAL CARE IF:  °· Your chest pain does not go away, even after treatment. °· You have a rash with blisters on your chest. °· You have a fever. °SEEK IMMEDIATE MEDICAL CARE IF:  °· You have increased chest pain or pain that spreads to your arm, neck, jaw, back, or abdomen. °· You have shortness of breath. °· You have an increasing cough, or you cough   up blood.  You have severe back or abdominal pain.  You feel nauseous or vomit.  You have severe weakness.  You faint.  You have chills. This is an emergency. Do not wait to see if the pain will go away. Get medical help at once. Call your local emergency services (911 in [place] Do not drive  yourself to the hospital. MAKE SURE YOU:   Understand these instructions.  Will watch your condition.  Will get help right away if you are not doing well or get worse. Document Released: 11/24/2004 Document Revised: 02/19/2013 Document Reviewed: 09/20/2007 [facility] Patient Information 2015 [place], [place]. This information is not intended to replace advice given to you by your health care provider. Make sure you discuss any questions you have with your health care provider.  Chest Pain (Nonspecific) It is often hard to give a specific diagnosis for the cause of chest pain. There is always a chance that your pain could be related to something serious, such as a heart attack or a blood clot in the lungs. You need to follow up with your health care provider for further evaluation. CAUSES   Heartburn.  Pneumonia or bronchitis.  Anxiety or stress.  Inflammation around your heart (pericarditis) or lung (pleuritis or pleurisy).  A blood clot in the lung.  A collapsed lung (pneumothorax). It can develop suddenly on its own (spontaneous pneumothorax) or from trauma to the chest.  Shingles infection (herpes zoster virus). The chest wall is composed of bones, muscles, and cartilage. Any of these can be the source of the pain.  The bones can be bruised by injury.  The muscles or cartilage can be strained by coughing or overwork.  The cartilage can be affected by inflammation and become sore (costochondritis). DIAGNOSIS  Lab tests or other studies may be needed to find the cause of your pain. Your health care provider may have you take a test called an ambulatory electrocardiogram (ECG). An ECG records your heartbeat patterns over a 24-hour period. You may also have other tests, such as:  Transthoracic echocardiogram (TTE). During echocardiography, sound waves are used to evaluate how blood flows through your heart.  Transesophageal echocardiogram (TEE).  Cardiac monitoring. This allows  your health care provider to monitor your heart rate and rhythm in real time.  Holter monitor. This is a portable device that records your heartbeat and can help diagnose heart arrhythmias. It allows your health care provider to track your heart activity for several days, if needed.  Stress tests by exercise or by giving medicine that makes the heart beat faster. TREATMENT   Treatment depends on what may be causing your chest pain. Treatment may include:  Acid blockers for heartburn.  Anti-inflammatory medicine.  Pain medicine for inflammatory conditions.  Antibiotics if an infection is present.  You may be advised to change lifestyle habits. This includes stopping smoking and avoiding alcohol, caffeine, and chocolate.  You may be advised to keep your head raised (elevated) when sleeping. This reduces the chance of acid going backward from your stomach into your esophagus. Most of the time, nonspecific chest pain will improve within 2-3 days with rest and mild pain medicine.  HOME CARE INSTRUCTIONS   If antibiotics were prescribed, take them as directed. Finish them even if you start to feel better.  For the next few days, avoid physical activities that bring on chest pain. Continue physical activities as directed.  Do not use any tobacco products, including cigarettes, chewing tobacco, or electronic cigarettes.  Avoid drinking alcohol.  Only take medicine as directed by your health care provider.  Follow your health care provider's suggestions for further testing if your chest pain does not go away.  Keep any follow-up appointments you made. If you do not go to an appointment, you could develop lasting (chronic) problems with pain. If there is any problem keeping an appointment, call to reschedule. SEEK MEDICAL CARE IF:   Your chest pain does not go away, even after treatment.  You have a rash with blisters on your chest.  You have a fever. SEEK IMMEDIATE MEDICAL CARE IF:     You have increased chest pain or pain that spreads to your arm, neck, jaw, back, or abdomen.  You have shortness of breath.  You have an increasing cough, or you cough up blood.  You have severe back or abdominal pain.  You feel nauseous or vomit.  You have severe weakness.  You faint.  You have chills. This is an emergency. Do not wait to see if the pain will go away. Get medical help at once. Call your local emergency services (911 in [place] Do not drive yourself to the hospital. MAKE SURE YOU:   Understand these instructions.  Will watch your condition.  Will get help right away if you are not doing well or get worse. Document Released: 11/24/2004 Document Revised: 02/19/2013 Document Reviewed: 09/20/2007 [facility] Patient Information 2015 [place], [place]. This information is not intended to replace advice given to you by your health care provider. Make sure you discuss any questions you have with your health care provider.

## 2013-12-13 NOTE — ED Provider Notes (Signed)
CSN: [id number]     Arrival date & time 12/13/13  1227 History   First MD Initiated Contact with Patient 12/13/13 1326     Chief Complaint  Patient presents with  . Chest Pain  . Rectal Bleeding     (Consider location/radiation/quality/duration/timing/severity/associated sxs/prior Treatment) Patient is a 63 y.o. male presenting with chest pain and hematochezia. The history is provided by the patient.  Chest Pain Pain location:  R chest Pain quality: aching and tightness   Pain radiates to:  Does not radiate Pain radiates to the back: yes   Pain severity:  Moderate Onset quality:  Gradual Timing:  Constant Progression:  Unchanged Chronicity:  New Context: at rest   Relieved by:  Nothing Worsened by:  Movement (twisting) Associated symptoms: no fever   Rectal Bleeding Associated symptoms: no fever     Past Medical History  Diagnosis Date  . Hypertension   . Thyroid disease     hypo  . OSA on CPAP   . Dyslipidemia (high LDL; low HDL)     low hdl  . Vitamin B12 deficiency 2011   Past Surgical History  Procedure Laterality Date  . Lower venous doppler  05-02-2006  . Hernia repair     Family History  Problem Relation Age of Onset  . Hypertension Other   . Mental illness Mother     dementia  . COPD Father   . Cancer Father     lung ca  . Stroke Father     brain aneurism   History  Substance Use Topics  . Smoking status: Former Research scientist (life sciences)  . Smokeless tobacco: Not on file     Comment: quit 30 years ago  . Alcohol Use: No    Review of Systems  Constitutional: Negative for fever and chills.  Cardiovascular: Positive for chest pain.  Gastrointestinal: Positive for hematochezia.  All other systems reviewed and are negative.     Allergies  Review of patient's allergies indicates no known allergies.  Home Medications   Prior to Admission medications   Medication Sig Start Date End Date Taking? Authorizing Provider  aspirin 81 MG EC tablet Take 81 mg by  mouth daily after breakfast.     Yes Historical Provider, MD  Cholecalciferol (VITAMIN D3) 1000 UNITS tablet Take 1,000 Units by mouth daily.     Yes Historical Provider, MD  cyanocobalamin 1000 MCG tablet Take 1,000 mcg by mouth daily.   Yes Historical Provider, MD  ibuprofen (ADVIL,MOTRIN) 200 MG tablet Take 400 mg by mouth every 8 (eight) hours as needed for headache or mild pain.   Yes Historical Provider, MD  levothyroxine (SYNTHROID, LEVOTHROID) 112 MCG tablet Take 1 tablet (112 mcg total) by mouth daily. 09/02/13  Yes Aleksei Plotnikov V, MD  metoprolol succinate (TOPROL-XL) 25 MG 24 hr tablet Take 25 mg by mouth daily.   Yes Historical Provider, MD   BP 116/69  Pulse 54  Temp(Src) 97.8 F (36.6 C) (Oral)  Resp 12  SpO2 99% Physical Exam  Nursing note and vitals reviewed. Constitutional: He is oriented to person, place, and time. He appears well-developed and well-nourished. No distress.  HENT:  Head: Normocephalic and atraumatic.  Mouth/Throat: No oropharyngeal exudate.  Eyes: EOM are normal. Pupils are equal, round, and reactive to light.  Neck: Normal range of motion. Neck supple.  Cardiovascular: Normal rate and regular rhythm.  Exam reveals no friction rub.   No murmur heard. Pulmonary/Chest: Effort normal and breath sounds normal. No respiratory distress.  He has no wheezes. He has no rales. He exhibits no tenderness.  Abdominal: He exhibits no distension. There is no tenderness. There is no rebound.  Musculoskeletal: Normal range of motion. He exhibits no edema.  Neurological: He is alert and oriented to person, place, and time.  Skin: He is not diaphoretic.    ED Course  Procedures (including critical care time) Labs Review Labs Reviewed  BASIC METABOLIC PANEL - Abnormal; Notable for the following:    GFR calc non Af Amer 77 (*)    GFR calc Af Amer 89 (*)    All other components within normal limits  PRO B NATRIURETIC PEPTIDE  CBC  I-STAT TROPOININ, ED  I-STAT  TROPOININ, ED    Imaging Review Dg Chest 2 View (if Patient Has Fever And/or Copd)  12/13/2013   CLINICAL DATA:  Right-sided chest pain, hypertension, prior smoker  EXAM: CHEST  2 VIEW  COMPARISON:  03/25/2013  FINDINGS: The heart size and mediastinal contours are within normal limits. Both lungs are clear. The visualized skeletal structures are unremarkable.  IMPRESSION: No active cardiopulmonary disease.   Electronically Signed   By: Kathreen Devoid   On: 12/13/2013 13:34   Ct Angio Chest Pe W/cm &/or Wo Cm  12/13/2013   CLINICAL DATA:  Initial evaluation for right-sided pleuritic chest pain beginning at 11 a.m., associated with some shortness of breath. Patient was mobile wing his lawn at home within the chest pain started. Also experiencing nausea.  EXAM: CT ANGIOGRAPHY CHEST WITH CONTRAST  TECHNIQUE: Multidetector CT imaging of the chest was performed using the standard protocol during bolus administration of intravenous contrast. Multiplanar CT image reconstructions and MIPs were obtained to evaluate the vascular anatomy.  CONTRAST:  155mL OMNIPAQUE IOHEXOL 350 MG/ML SOLN  COMPARISON:  Chest radiograph performed today  FINDINGS: There are no filling defects in the pulmonary arterial system. The thoracic aorta shows no dissection or dilatation. There is no pericardial effusion. There is no significant hilar or mediastinal adenopathy. There is no significant pleural effusion. Heart size does not appear to be enlarged.  Proximal left pulmonary arteries 18 mm, and 1 cm distal to this the pulmonary artery is 25 mm.  There is no significant pulmonary parenchymal abnormality. There is no mass, infiltrate, or edema.  There are no acute musculoskeletal findings. Images of the upper abdomen are unremarkable.  Review of the MIP images confirms the above findings.  IMPRESSION: No acute findings.  No evidence of pulmonary embolism.  There does appear to be a degree of stenosis involving the proximal left pulmonary  artery with mild poststenotic dilatation. Clinical significance uncertain. This could be further evaluated with echocardiogram, stress test, or MRI.   Electronically Signed   By: Skipper Cliche M.D.   On: 12/13/2013 14:51     EKG Interpretation   Date/Time:  Friday December 13 2013 12:36:34 EDT Ventricular Rate:  66 PR Interval:  148 QRS Duration: 95 QT Interval:  412 QTC Calculation: 432 R Axis:   57 Text Interpretation:  Sinus rhythm Similar to prior Confirmed by Mingo Amber   MD, [place] ([id number]) on 12/13/2013 1:22:01 PM      MDM   Final diagnoses:  Chest pain, unspecified chest pain type    21M presents with R sided chest pain. 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 No fevers. No cough, no hemoptysis. Described as tightness. No abdominal pain.  AFVSS here. No reproducible chest pain. HEART score 3 -  moderately suspicious, 2 risk factors, age - will do serial troponins. Likely musculoskeletal pain. PE scan negative - patient persistently saying pleuritic in nature. Serial troponins negative. Stable for discharge.  Evelina Bucy, MD 12/13/13 [phone]

## 2013-12-13 NOTE — ED Notes (Signed)
Pt reports he began to have R side chest pain at 11am with some sob. No dizziness. Had some lightheadedness and nausea when pain began. Pt was mowing when the chest pain started.

## 2013-12-13 NOTE — ED Notes (Signed)
Patient transported to CT 

## 2013-12-13 NOTE — ED Notes (Signed)
Bed: WT21 Expected date:  Expected time:  Means of arrival:  Comments: Triage 5

## 2014-03-10 ENCOUNTER — Ambulatory Visit (INDEPENDENT_AMBULATORY_CARE_PROVIDER_SITE_OTHER): Payer: 59 | Admitting: Internal Medicine

## 2014-03-10 ENCOUNTER — Encounter: Payer: Self-pay | Admitting: Internal Medicine

## 2014-03-10 VITALS — BP 140/90 | HR 57 | Temp 98.2°F | Wt 205.0 lb

## 2014-03-10 DIAGNOSIS — I10 Essential (primary) hypertension: Secondary | ICD-10-CM

## 2014-03-10 DIAGNOSIS — E038 Other specified hypothyroidism: Secondary | ICD-10-CM

## 2014-03-10 DIAGNOSIS — E034 Atrophy of thyroid (acquired): Secondary | ICD-10-CM

## 2014-03-10 DIAGNOSIS — E538 Deficiency of other specified B group vitamins: Secondary | ICD-10-CM

## 2014-03-10 NOTE — Assessment & Plan Note (Signed)
Chronic. 

## 2014-03-10 NOTE — Progress Notes (Signed)
   Subjective:    HPI   Review of Systems  Constitutional: Negative for appetite change, fatigue and unexpected weight change.  HENT: Negative for nosebleeds and trouble swallowing.   Eyes: Negative for itching and visual disturbance.  Cardiovascular: Negative for palpitations and leg swelling.  Gastrointestinal: Negative for blood in stool and abdominal distention.  Genitourinary: Negative for frequency and hematuria.  Musculoskeletal: Negative for back pain, joint swelling and gait problem.  Neurological: Negative for dizziness, tremors, speech difficulty and weakness.  Psychiatric/Behavioral: Negative for sleep disturbance, dysphoric mood and agitation. The patient is not nervous/anxious.    Wt Readings from Last 3 Encounters:  03/10/14 205 lb (92.987 kg)  09/02/13 200 lb (90.719 kg)  03/20/13 194 lb (87.998 kg)   BP Readings from Last 3 Encounters:  03/10/14 140/90  12/13/13 117/64  09/02/13 138/90         Objective:   Physical Exam  Constitutional: He is oriented to person, place, and time. He appears well-developed. No distress.  NAD  HENT:  Mouth/Throat: Oropharynx is clear and moist.  Eyes: Conjunctivae are normal. Pupils are equal, round, and reactive to light.  Neck: Normal range of motion. No JVD present. No thyromegaly present.  Cardiovascular: Normal rate, regular rhythm, normal heart sounds and intact distal pulses.  Exam reveals no gallop and no friction rub.   No murmur heard. Pulmonary/Chest: Effort normal and breath sounds normal. No respiratory distress. He has no wheezes. He has no rales. He exhibits no tenderness.  Abdominal: Soft. Bowel sounds are normal. He exhibits no distension and no mass. There is no tenderness. There is no rebound and no guarding.  Musculoskeletal: Normal range of motion. He exhibits no edema or tenderness.  Lymphadenopathy:    He has no cervical adenopathy.  Neurological: He is alert and oriented to person, place, and time.  He has normal reflexes. No cranial nerve deficit. He exhibits normal muscle tone. He displays a negative Romberg sign. Coordination and gait normal.  No meningeal signs  Skin: Skin is warm and dry. No rash noted.  Psychiatric: He has a normal mood and affect. His behavior is normal. Judgment and thought content normal.     Subjective:  Patient ID: Shane Short, male   DOB: [birth year], 64 y.o.   MRN: [id number]

## 2014-03-10 NOTE — Progress Notes (Signed)
Pre visit review using our clinic review tool, if applicable. No additional management support is needed unless otherwise documented below in the visit note. 

## 2014-03-11 ENCOUNTER — Other Ambulatory Visit (INDEPENDENT_AMBULATORY_CARE_PROVIDER_SITE_OTHER): Payer: 59

## 2014-03-11 ENCOUNTER — Other Ambulatory Visit: Payer: Self-pay | Admitting: Internal Medicine

## 2014-03-11 DIAGNOSIS — E539 Vitamin B deficiency, unspecified: Secondary | ICD-10-CM

## 2014-03-11 DIAGNOSIS — I10 Essential (primary) hypertension: Secondary | ICD-10-CM

## 2014-03-11 DIAGNOSIS — J0100 Acute maxillary sinusitis, unspecified: Secondary | ICD-10-CM

## 2014-03-11 DIAGNOSIS — E039 Hypothyroidism, unspecified: Secondary | ICD-10-CM

## 2014-03-11 LAB — BASIC METABOLIC PANEL
BUN: 13 mg/dL (ref 6–23)
CO2: 28 mEq/L (ref 19–32)
Calcium: 9 mg/dL (ref 8.4–10.5)
Chloride: 104 mEq/L (ref 96–112)
Creatinine, Ser: 1 mg/dL (ref 0.4–1.5)
GFR: 85.23 mL/min (ref >60.00)
Glucose, Bld: 98 mg/dL (ref 70–99)
Potassium: 4 mEq/L (ref 3.5–5.1)
Sodium: 138 mEq/L (ref 135–145)

## 2014-03-11 LAB — TSH: TSH: 5.89 u[IU]/mL — AB (ref 0.35–4.50)

## 2014-03-11 LAB — LIPID PANEL
Cholesterol: 180 mg/dL (ref 0–200)
HDL: 28.5 mg/dL — ABNORMAL LOW (ref >39.00)
LDL CALC: 116 mg/dL — AB (ref 0–99)
NonHDL: 151.5
TRIGLYCERIDES: 179 mg/dL — AB (ref 0.0–149.0)
Total CHOL/HDL Ratio: 6
VLDL: 35.8 mg/dL (ref 0.0–40.0)

## 2014-03-11 LAB — HEPATIC FUNCTION PANEL
ALT: 22 U/L (ref 0–53)
AST: 22 U/L (ref 0–37)
Albumin: 4 g/dL (ref 3.5–5.2)
Alkaline Phosphatase: 58 U/L (ref 39–117)
BILIRUBIN DIRECT: 0.1 mg/dL (ref 0.0–0.3)
Total Bilirubin: 0.7 mg/dL (ref 0.2–1.2)
Total Protein: 7.6 g/dL (ref 6.0–8.3)

## 2014-03-11 LAB — VITAMIN B12: VITAMIN B 12: 1415 pg/mL — AB (ref 211–911)

## 2014-03-11 MED ORDER — LEVOTHYROXINE SODIUM 125 MCG PO TABS: 125.0000 ug | ORAL_TABLET | Freq: Every day | ORAL | Status: DC

## 2014-03-13 NOTE — Assessment & Plan Note (Signed)
Continue with current prescription therapy as reflected on the Med list.  

## 2014-07-02 ENCOUNTER — Encounter: Payer: Self-pay | Admitting: Gastroenterology

## 2014-08-03 ENCOUNTER — Encounter (HOSPITAL_COMMUNITY): Payer: Self-pay | Admitting: Emergency Medicine

## 2014-08-03 ENCOUNTER — Emergency Department (HOSPITAL_COMMUNITY): Payer: 59

## 2014-08-03 ENCOUNTER — Emergency Department (HOSPITAL_COMMUNITY)
Admission: EM | Admit: 2014-08-03 | Discharge: 2014-08-04 | Disposition: A | Discharge status: Home / Self Care | Payer: 59 | Attending: Emergency Medicine | Admitting: Emergency Medicine

## 2014-08-03 DIAGNOSIS — I10 Essential (primary) hypertension: Secondary | ICD-10-CM | POA: Insufficient documentation

## 2014-08-03 DIAGNOSIS — Z87891 Personal history of nicotine dependence: Secondary | ICD-10-CM | POA: Insufficient documentation

## 2014-08-03 DIAGNOSIS — G4733 Obstructive sleep apnea (adult) (pediatric): Secondary | ICD-10-CM | POA: Insufficient documentation

## 2014-08-03 DIAGNOSIS — Z79899 Other long term (current) drug therapy: Secondary | ICD-10-CM | POA: Diagnosis not present

## 2014-08-03 DIAGNOSIS — N200 Calculus of kidney: Secondary | ICD-10-CM | POA: Insufficient documentation

## 2014-08-03 DIAGNOSIS — E079 Disorder of thyroid, unspecified: Secondary | ICD-10-CM | POA: Insufficient documentation

## 2014-08-03 DIAGNOSIS — R109 Unspecified abdominal pain: Secondary | ICD-10-CM

## 2014-08-03 DIAGNOSIS — Z9861 Coronary angioplasty status: Secondary | ICD-10-CM | POA: Insufficient documentation

## 2014-08-03 DIAGNOSIS — Z7982 Long term (current) use of aspirin: Secondary | ICD-10-CM | POA: Insufficient documentation

## 2014-08-03 LAB — CBC WITH DIFFERENTIAL/PLATELET
BASOS ABS: 0 10*3/uL (ref 0.0–0.1)
BASOS PCT: 0 % (ref 0–1)
EOS ABS: 0.3 10*3/uL (ref 0.0–0.7)
EOS PCT: 3 % (ref 0–5)
HEMATOCRIT: 40.6 % (ref 39.0–52.0)
Hemoglobin: 13.4 g/dL (ref 13.0–17.0)
LYMPHS PCT: 16 % (ref 12–46)
Lymphs Abs: 1.6 10*3/uL (ref 0.7–4.0)
MCH: 28 pg (ref 26.0–34.0)
MCHC: 33 g/dL (ref 30.0–36.0)
MCV: 84.9 fL (ref 78.0–100.0)
MONOS PCT: 7 % (ref 3–12)
Monocytes Absolute: 0.7 10*3/uL (ref 0.1–1.0)
NEUTROS PCT: 74 % (ref 43–77)
Neutro Abs: 7.4 10*3/uL (ref 1.7–7.7)
Platelets: 183 10*3/uL (ref 150–400)
RBC: 4.78 MIL/uL (ref 4.22–5.81)
RDW: 12.4 % (ref 11.5–15.5)
WBC: 10 10*3/uL (ref 4.0–10.5)

## 2014-08-03 LAB — BASIC METABOLIC PANEL
Anion gap: 8 (ref 5–15)
BUN: 14 mg/dL (ref 6–20)
CO2: 26 mmol/L (ref 22–32)
Calcium: 9.5 mg/dL (ref 8.9–10.3)
Chloride: 105 mmol/L (ref 101–111)
Creatinine, Ser: 1.12 mg/dL (ref 0.61–1.24)
GFR calc Af Amer: >60 mL/min (ref >60)
GFR calc non Af Amer: >60 mL/min (ref >60)
Glucose, Bld: 110 mg/dL — ABNORMAL HIGH (ref 65–99)
Potassium: 3.7 mmol/L (ref 3.5–5.1)
SODIUM: 139 mmol/L (ref 135–145)

## 2014-08-03 MED ORDER — ONDANSETRON HCL 4 MG/2ML IJ SOLN
4.0000 mg | Freq: Once | INTRAMUSCULAR | Status: AC
  Administered 2014-08-03: 4 mg via INTRAVENOUS
  Filled 2014-08-03: qty 2

## 2014-08-03 MED ORDER — HYDROMORPHONE HCL 1 MG/ML IJ SOLN
1.0000 mg | Freq: Once | INTRAMUSCULAR | Status: AC
  Administered 2014-08-03: 1 mg via INTRAVENOUS
  Filled 2014-08-03: qty 1

## 2014-08-03 NOTE — ED Provider Notes (Signed)
CSN: [id number]     Arrival date & time 08/03/14  1944 History   First MD Initiated Contact with Patient 08/03/14 2011     Chief Complaint  Patient presents with  . Flank Pain     (Consider location/radiation/quality/duration/timing/severity/associated sxs/prior Treatment) HPI Comments: Patient with a history of Nephrolithiasis presents today with a chief complaint of left flank pain.  He reports that the pain has been present since yesterday afternoon.  Pain radiating to the left groin area.  He states that pain is similar to pain that he has had in the past with kidney stones.  He has not taken anything for pain prior to arrival.  He reports associated nausea, but denies vomiting.  He also reports associated urinary urgency.  He reports that his last urination was a couple of hours ago and that he was able to urinate normally.  He denies dysuria, fever, chills, diarrhea, or scrotal pain/swelling.    Patient is a 64 y.o. male presenting with flank pain. The history is provided by the patient.  Flank Pain    Past Medical History  Diagnosis Date  . Hypertension   . Thyroid disease     hypo  . OSA on CPAP   . Dyslipidemia (high LDL; low HDL)     low hdl  . Vitamin B12 deficiency 2011   Past Surgical History  Procedure Laterality Date  . Lower venous doppler  05-02-2006  . Hernia repair     Family History  Problem Relation Age of Onset  . Hypertension Other   . Mental illness Mother     dementia  . COPD Father   . Cancer Father     lung ca  . Stroke Father     brain aneurism   History  Substance Use Topics  . Smoking status: Former Research scientist (life sciences)  . Smokeless tobacco: Not on file     Comment: quit 30 years ago  . Alcohol Use: No    Review of Systems  Genitourinary: Positive for flank pain.  All other systems reviewed and are negative.     Allergies  Review of patient's allergies indicates no known allergies.  Home Medications   Prior to Admission medications    Medication Sig Start Date End Date Taking? Authorizing Provider  aspirin 81 MG EC tablet Take 81 mg by mouth daily after breakfast.     Yes Historical Provider, MD  Cholecalciferol (VITAMIN D3) 1000 UNITS tablet Take 1,000 Units by mouth daily.     Yes Historical Provider, MD  cyanocobalamin 1000 MCG tablet Take 1,000 mcg by mouth every other day.    Yes Historical Provider, MD  levothyroxine (SYNTHROID, LEVOTHROID) 100 MCG tablet Take 100 mcg by mouth daily before breakfast.   Yes Historical Provider, MD  metoprolol succinate (TOPROL-XL) 25 MG 24 hr tablet Take 25 mg by mouth daily.   Yes Historical Provider, MD  ibuprofen (ADVIL,MOTRIN) 800 MG tablet Take 1 tablet (800 mg total) by mouth 3 (three) times daily. 12/13/13   Evelina Bucy, MD  levothyroxine (SYNTHROID, LEVOTHROID) 125 MCG tablet Take 1 tablet (125 mcg total) by mouth daily before breakfast. Patient not taking: Reported on 08/03/2014 03/11/14   Aleksei Plotnikov V, MD   BP 155/85 mmHg  Pulse 54  Temp(Src) 97.6 F (36.4 C) (Oral)  Resp 18  SpO2 100% Physical Exam  Constitutional: He appears well-developed and well-nourished.  HENT:  Head: Normocephalic and atraumatic.  Mouth/Throat: Oropharynx is clear and moist.  Neck: Normal range of  motion. Neck supple.  Cardiovascular: Normal rate, regular rhythm and normal heart sounds.   Pulmonary/Chest: Effort normal and breath sounds normal.  Abdominal: Soft. Bowel sounds are normal. He exhibits no distension and no mass. There is no tenderness. There is CVA tenderness. There is no rebound and no guarding.  Left CVA tenderness  Musculoskeletal: Normal range of motion.  Neurological: He is alert.  Skin: Skin is warm and dry.  Psychiatric: He has a normal mood and affect.  Nursing note and vitals reviewed.   ED Course  Procedures (including critical care time) Labs Review Labs Reviewed  URINALYSIS, ROUTINE W REFLEX MICROSCOPIC (NOT AT [facility])  CBC WITH DIFFERENTIAL/PLATELET   BASIC METABOLIC PANEL    Imaging Review Ct Renal Stone Study  08/04/2014   CLINICAL DATA:  Flank pain. Symptoms have progressively become worse. Patient has history of kidney stones.  EXAM: CT ABDOMEN AND PELVIS WITHOUT CONTRAST  TECHNIQUE: Multidetector CT imaging of the abdomen and pelvis was performed following the standard protocol without IV contrast.  COMPARISON:  None.  FINDINGS: Marked LEFT hydronephrosis and hydroureter. 3 mm LEFT UVJ stone. Small BILATERAL intrarenal calculi. No right-sided obstruction.  Within limits for evaluation in the noncontrast state, no intra-abdominal visceral abnormality. Unremarkable bowel. No free fluid or free air. No skeletal abnormalities. Mild vascular calcification. Visualized lung bases clear.  IMPRESSION: BILATERAL nephrolithiasis.  LEFT hydronephrosis and hydroureter secondary to a 3 mm stone LEFT UVJ.   Electronically Signed   By: Rolla Flatten M.D.   On: 08/04/2014 00:08     EKG Interpretation None     11:15 PM Reassessed patient.  He reports that he is having pain, but pain is tolerable.  Patient declined further pain medication.   1:00 AM Patient signed out to [facility], PA-C at shift change.  UA pending.   MDM   Final diagnoses:  Left flank pain   Patient with a history of kidney stones presents today with left flank pain.  CT showing a 3 mm stone in the left UVJ.  Creatine is WNL.  Pain controlled in the ED.  UA pending at shift change.  Alvina Chou, PA-C will follow up on the UA.  Plan is for the patient to follow up with Urology.    Hyman Bible, PA-C 08/04/14 [place], MD 08/05/14 [phone]

## 2014-08-03 NOTE — ED Notes (Signed)
Pt is c/o right flank pain that started yesterday and has progressively gotten worse  Pt is nauseated without vomiting  Pt has hx of kidney stones  Pt states he feels like he needs to void and cannot

## 2014-08-04 LAB — URINALYSIS, ROUTINE W REFLEX MICROSCOPIC
BILIRUBIN URINE: NEGATIVE
Glucose, UA: NEGATIVE mg/dL
Ketones, ur: NEGATIVE mg/dL
Leukocytes, UA: NEGATIVE
NITRITE: NEGATIVE
Protein, ur: NEGATIVE mg/dL
SPECIFIC GRAVITY, URINE: 1.013 (ref 1.005–1.030)
Urobilinogen, UA: 0.2 mg/dL (ref 0.0–1.0)
pH: 5 (ref 5.0–8.0)

## 2014-08-04 LAB — URINE MICROSCOPIC-ADD ON

## 2014-08-04 MED ORDER — HYDROCODONE-ACETAMINOPHEN 5-325 MG PO TABS: 2.0000 | ORAL_TABLET | ORAL | Status: DC | PRN

## 2014-08-04 NOTE — ED Provider Notes (Signed)
1:13 AM Patient signed out to me by Shane Bible, PA-C. Patient pending urinalysis.   3:06 AM Urinalysis unremarkable for acute infection. Patient will be discharged with Vicodin for pain. Patient advised to follow up with PCP and return to the ED with worsening or concerning symptoms.   Results for orders placed or performed during the hospital encounter of 08/03/14  U/A (may I&O cath if menses)  Result Value Ref Range   Color, Urine YELLOW YELLOW   APPearance CLEAR CLEAR   Specific Gravity, Urine 1.013 1.005 - 1.030   pH 5.0 5.0 - 8.0   Glucose, UA NEGATIVE NEGATIVE mg/dL   Hgb urine dipstick SMALL (A) NEGATIVE   Bilirubin Urine NEGATIVE NEGATIVE   Ketones, ur NEGATIVE NEGATIVE mg/dL   Protein, ur NEGATIVE NEGATIVE mg/dL   Urobilinogen, UA 0.2 0.0 - 1.0 mg/dL   Nitrite NEGATIVE NEGATIVE   Leukocytes, UA NEGATIVE NEGATIVE  CBC with Differential/Platelet  Result Value Ref Range   WBC 10.0 4.0 - 10.5 K/uL   RBC 4.78 4.22 - 5.81 MIL/uL   Hemoglobin 13.4 13.0 - 17.0 g/dL   HCT 40.6 39.0 - 52.0 %   MCV 84.9 78.0 - 100.0 fL   MCH 28.0 26.0 - 34.0 pg   MCHC 33.0 30.0 - 36.0 g/dL   RDW 12.4 11.5 - 15.5 %   Platelets 183 150 - 400 K/uL   Neutrophils Relative % 74 43 - 77 %   Neutro Abs 7.4 1.7 - 7.7 K/uL   Lymphocytes Relative 16 12 - 46 %   Lymphs Abs 1.6 0.7 - 4.0 K/uL   Monocytes Relative 7 3 - 12 %   Monocytes Absolute 0.7 0.1 - 1.0 K/uL   Eosinophils Relative 3 0 - 5 %   Eosinophils Absolute 0.3 0.0 - 0.7 K/uL   Basophils Relative 0 0 - 1 %   Basophils Absolute 0.0 0.0 - 0.1 K/uL  Basic metabolic panel  Result Value Ref Range   Sodium 139 135 - 145 mmol/L   Potassium 3.7 3.5 - 5.1 mmol/L   Chloride 105 101 - 111 mmol/L   CO2 26 22 - 32 mmol/L   Glucose, Bld 110 (H) 65 - 99 mg/dL   BUN 14 6 - 20 mg/dL   Creatinine, Ser 1.12 0.61 - 1.24 mg/dL   Calcium 9.5 8.9 - 10.3 mg/dL   GFR calc non Af Amer >60 >60 mL/min   GFR calc Af Amer >60 >60 mL/min   Anion gap 8 5 - 15   Urine microscopic-add on  Result Value Ref Range   RBC / HPF 3-6 <3 RBC/hpf   Ct Renal Stone Study  08/04/2014   CLINICAL DATA:  Flank pain. Symptoms have progressively become worse. Patient has history of kidney stones.  EXAM: CT ABDOMEN AND PELVIS WITHOUT CONTRAST  TECHNIQUE: Multidetector CT imaging of the abdomen and pelvis was performed following the standard protocol without IV contrast.  COMPARISON:  None.  FINDINGS: Marked LEFT hydronephrosis and hydroureter. 3 mm LEFT UVJ stone. Small BILATERAL intrarenal calculi. No right-sided obstruction.  Within limits for evaluation in the noncontrast state, no intra-abdominal visceral abnormality. Unremarkable bowel. No free fluid or free air. No skeletal abnormalities. Mild vascular calcification. Visualized lung bases clear.  IMPRESSION: BILATERAL nephrolithiasis.  LEFT hydronephrosis and hydroureter secondary to a 3 mm stone LEFT UVJ.   Electronically Signed   By: Rolla Flatten M.D.   On: 08/04/2014 00:08      Alvina Chou, PA-C 08/04/14 0354  Jola Schmidt, MD 08/04/14 [phone]

## 2014-08-04 NOTE — Discharge Instructions (Signed)
Take vicodin as needed for pain. Refer to attached documents for more information. Follow up with your doctor as needed.

## 2014-09-05 ENCOUNTER — Telehealth: Payer: Self-pay

## 2014-09-05 DIAGNOSIS — Z0000 Encounter for general adult medical examination without abnormal findings: Secondary | ICD-10-CM

## 2014-09-05 DIAGNOSIS — E785 Hyperlipidemia, unspecified: Secondary | ICD-10-CM

## 2014-09-05 NOTE — Telephone Encounter (Signed)
Advised patient that dr Alain Marion said it is ok to have labs done early before office visit, orders for lab have been placed and patient can come at his earliest convenience

## 2014-09-08 ENCOUNTER — Ambulatory Visit (INDEPENDENT_AMBULATORY_CARE_PROVIDER_SITE_OTHER): Payer: 59 | Admitting: Internal Medicine

## 2014-09-08 ENCOUNTER — Encounter: Payer: Self-pay | Admitting: Internal Medicine

## 2014-09-08 ENCOUNTER — Other Ambulatory Visit (INDEPENDENT_AMBULATORY_CARE_PROVIDER_SITE_OTHER): Payer: 59

## 2014-09-08 VITALS — BP 110/68 | HR 46 | Wt 183.0 lb

## 2014-09-08 DIAGNOSIS — E538 Deficiency of other specified B group vitamins: Secondary | ICD-10-CM

## 2014-09-08 DIAGNOSIS — R001 Bradycardia, unspecified: Secondary | ICD-10-CM

## 2014-09-08 DIAGNOSIS — Z23 Encounter for immunization: Secondary | ICD-10-CM | POA: Diagnosis not present

## 2014-09-08 DIAGNOSIS — N200 Calculus of kidney: Secondary | ICD-10-CM | POA: Diagnosis not present

## 2014-09-08 DIAGNOSIS — I10 Essential (primary) hypertension: Secondary | ICD-10-CM

## 2014-09-08 DIAGNOSIS — Z0000 Encounter for general adult medical examination without abnormal findings: Secondary | ICD-10-CM

## 2014-09-08 LAB — URINALYSIS, ROUTINE W REFLEX MICROSCOPIC
BILIRUBIN URINE: NEGATIVE
Hgb urine dipstick: NEGATIVE
KETONES UR: NEGATIVE
Leukocytes, UA: NEGATIVE
NITRITE: NEGATIVE
PH: 6 (ref 5.0–8.0)
Specific Gravity, Urine: 1.025 (ref 1.000–1.030)
TOTAL PROTEIN, URINE-UPE24: NEGATIVE
UROBILINOGEN UA: 0.2 (ref 0.0–1.0)
Urine Glucose: NEGATIVE

## 2014-09-08 LAB — CBC WITH DIFFERENTIAL/PLATELET
Basophils Absolute: 0 10*3/uL (ref 0.0–0.1)
Basophils Relative: 0.7 % (ref 0.0–3.0)
EOS ABS: 0.5 10*3/uL (ref 0.0–0.7)
EOS PCT: 8.4 % — AB (ref 0.0–5.0)
HEMATOCRIT: 39.8 % (ref 39.0–52.0)
Hemoglobin: 13.5 g/dL (ref 13.0–17.0)
Lymphocytes Relative: 32.6 % (ref 12.0–46.0)
Lymphs Abs: 1.8 10*3/uL (ref 0.7–4.0)
MCHC: 34 g/dL (ref 30.0–36.0)
MCV: 85 fl (ref 78.0–100.0)
MONOS PCT: 8.7 % (ref 3.0–12.0)
Monocytes Absolute: 0.5 10*3/uL (ref 0.1–1.0)
NEUTROS ABS: 2.7 10*3/uL (ref 1.4–7.7)
NEUTROS PCT: 49.6 % (ref 43.0–77.0)
Platelets: 199 10*3/uL (ref 150.0–400.0)
RBC: 4.68 Mil/uL (ref 4.22–5.81)
RDW: 12.8 % (ref 11.5–15.5)
WBC: 5.4 10*3/uL (ref 4.0–10.5)

## 2014-09-08 LAB — COMPREHENSIVE METABOLIC PANEL
ALT: 16 U/L (ref 0–53)
AST: 19 U/L (ref 0–37)
Albumin: 4 g/dL (ref 3.5–5.2)
Alkaline Phosphatase: 64 U/L (ref 39–117)
BUN: 13 mg/dL (ref 6–23)
CALCIUM: 9.4 mg/dL (ref 8.4–10.5)
CHLORIDE: 105 meq/L (ref 96–112)
CO2: 28 meq/L (ref 19–32)
Creatinine, Ser: 1.03 mg/dL (ref 0.40–1.50)
GFR: 77.52 mL/min (ref >60.00)
Glucose, Bld: 91 mg/dL (ref 70–99)
Potassium: 4.8 mEq/L (ref 3.5–5.1)
SODIUM: 140 meq/L (ref 135–145)
Total Bilirubin: 0.5 mg/dL (ref 0.2–1.2)
Total Protein: 7.2 g/dL (ref 6.0–8.3)

## 2014-09-08 LAB — LIPID PANEL
CHOL/HDL RATIO: 5
Cholesterol: 158 mg/dL (ref 0–200)
HDL: 33.7 mg/dL — ABNORMAL LOW (ref >39.00)
LDL Cholesterol: 101 mg/dL — ABNORMAL HIGH (ref 0–99)
NONHDL: 124.3
TRIGLYCERIDES: 118 mg/dL (ref 0.0–149.0)
VLDL: 23.6 mg/dL (ref 0.0–40.0)

## 2014-09-08 LAB — TSH: TSH: 0.71 u[IU]/mL (ref 0.35–4.50)

## 2014-09-08 LAB — PSA: PSA: 0.34 ng/mL (ref 0.10–4.00)

## 2014-09-08 MED ORDER — SAW PALMETTO (SERENOA REPENS) 160 MG PO CAPS: ORAL_CAPSULE | ORAL | Status: AC

## 2014-09-08 NOTE — Progress Notes (Signed)
Subjective:  Patient ID: Shane Short, male    DOB: [birth year]  Age: 64 y.o. MRN: [id number]  CC: No chief complaint on file.   HPI The patient is here for a wellness exam. POSEIDON PAM presents for hypothyroidism, B12 deficiency, kidney stone. C/o occ urinary burning on occasion.  Outpatient Prescriptions Prior to Visit  Medication Sig Dispense Refill  . aspirin 81 MG EC tablet Take 81 mg by mouth daily after breakfast.      . Cholecalciferol (VITAMIN D3) 1000 UNITS tablet Take 1,000 Units by mouth daily.      . cyanocobalamin 1000 MCG tablet Take 1,000 mcg by mouth every other day.     . levothyroxine (SYNTHROID, LEVOTHROID) 125 MCG tablet Take 1 tablet (125 mcg total) by mouth daily before breakfast. 90 tablet 3  . levothyroxine (SYNTHROID, LEVOTHROID) 100 MCG tablet Take 100 mcg by mouth daily before breakfast.    . metoprolol succinate (TOPROL-XL) 25 MG 24 hr tablet Take 25 mg by mouth daily.    Marland Kitchen ibuprofen (ADVIL,MOTRIN) 800 MG tablet Take 1 tablet (800 mg total) by mouth 3 (three) times daily. (Patient not taking: Reported on 09/08/2014) 21 tablet 0  . HYDROcodone-acetaminophen (NORCO/VICODIN) 5-325 MG per tablet Take 2 tablets by mouth every 4 (four) hours as needed. (Patient not taking: Reported on 09/08/2014) 6 tablet 0   No facility-administered medications prior to visit.    ROS Review of Systems  Constitutional: Negative for appetite change, fatigue and unexpected weight change.  HENT: Negative for congestion, nosebleeds, sneezing, sore throat and trouble swallowing.   Eyes: Negative for itching and visual disturbance.  Respiratory: Negative for cough.   Cardiovascular: Negative for chest pain, palpitations and leg swelling.  Gastrointestinal: Negative for nausea, diarrhea, blood in stool and abdominal distention.  Genitourinary: Positive for dysuria. Negative for frequency and hematuria.  Musculoskeletal: Negative for back pain, joint swelling, gait problem and  neck pain.  Skin: Negative for rash.  Neurological: Negative for dizziness, tremors, speech difficulty and weakness.  Psychiatric/Behavioral: Negative for sleep disturbance, dysphoric mood and agitation. The patient is not nervous/anxious.     Objective:  BP 110/68 mmHg  Pulse 46  Wt 183 lb (83.008 kg)  SpO2 97%  BP Readings from Last 3 Encounters:  09/08/14 110/68  08/04/14 120/68  03/10/14 140/90    Wt Readings from Last 3 Encounters:  09/08/14 183 lb (83.008 kg)  03/10/14 205 lb (92.987 kg)  09/02/13 200 lb (90.719 kg)    Physical Exam  Constitutional: He is oriented to person, place, and time. He appears well-developed. No distress.  NAD  HENT:  Mouth/Throat: Oropharynx is clear and moist.  Eyes: Conjunctivae are normal. Pupils are equal, round, and reactive to light.  Neck: Normal range of motion. No JVD present. No thyromegaly present.  Cardiovascular: Normal rate, regular rhythm, normal heart sounds and intact distal pulses.  Exam reveals no gallop and no friction rub.   No murmur heard. Pulmonary/Chest: Effort normal and breath sounds normal. No respiratory distress. He has no wheezes. He has no rales. He exhibits no tenderness.  Abdominal: Soft. Bowel sounds are normal. He exhibits no distension and no mass. There is no tenderness. There is no rebound and no guarding.  Genitourinary: Rectum normal and penis normal. Guaiac negative stool. No penile tenderness.  Musculoskeletal: Normal range of motion. He exhibits no edema or tenderness.  Lymphadenopathy:    He has no cervical adenopathy.  Neurological: He is alert and oriented to person,  place, and time. He has normal reflexes. No cranial nerve deficit. He exhibits normal muscle tone. He displays a negative Romberg sign. Coordination and gait normal.  Skin: Skin is warm and dry. No rash noted.  Psychiatric: He has a normal mood and affect. His behavior is normal. Judgment and thought content normal.  Prostate is a  little enlarged, NT.  Lab Results  Component Value Date   WBC 5.4 09/08/2014   HGB 13.5 09/08/2014   HCT 39.8 09/08/2014   PLT 199.0 09/08/2014   GLUCOSE 91 09/08/2014   CHOL 158 09/08/2014   TRIG 118.0 09/08/2014   HDL 33.70* 09/08/2014   LDLCALC 101* 09/08/2014   ALT 16 09/08/2014   AST 19 09/08/2014   NA 140 09/08/2014   K 4.8 09/08/2014   CL 105 09/08/2014   CREATININE 1.03 09/08/2014   BUN 13 09/08/2014   CO2 28 09/08/2014   TSH 0.71 09/08/2014   PSA 0.34 09/08/2014    Ct Renal Stone Study  08/04/2014   CLINICAL DATA:  Flank pain. Symptoms have progressively become worse. Patient has history of kidney stones.  EXAM: CT ABDOMEN AND PELVIS WITHOUT CONTRAST  TECHNIQUE: Multidetector CT imaging of the abdomen and pelvis was performed following the standard protocol without IV contrast.  COMPARISON:  None.  FINDINGS: Marked LEFT hydronephrosis and hydroureter. 3 mm LEFT UVJ stone. Small BILATERAL intrarenal calculi. No right-sided obstruction.  Within limits for evaluation in the noncontrast state, no intra-abdominal visceral abnormality. Unremarkable bowel. No free fluid or free air. No skeletal abnormalities. Mild vascular calcification. Visualized lung bases clear.  IMPRESSION: BILATERAL nephrolithiasis.  LEFT hydronephrosis and hydroureter secondary to a 3 mm stone LEFT UVJ.   Electronically Signed   By: Rolla Flatten M.D.   On: 08/04/2014 00:08    Assessment & Plan:   Diagnoses and all orders for this visit:  Need for prophylactic vaccination against Streptococcus pneumoniae (pneumococcus) Orders: -     Pneumococcal conjugate vaccine 13-valent IM  Other orders -     saw palmetto 160 MG capsule; 1 po qd  I have discontinued Mr. Canter's HYDROcodone-acetaminophen. I am also having him start on saw palmetto. Additionally, I am having him maintain his aspirin, cholecalciferol, cyanocobalamin, ibuprofen, and levothyroxine.  Meds ordered this encounter  Medications  . saw  palmetto 160 MG capsule    Sig: 1 po qd    Dispense:  100 capsule    Refill:  3     Follow-up: Return in about 6 months (around 03/11/2015) for a follow-up visit.  Walker Kehr, MD

## 2014-09-08 NOTE — Progress Notes (Signed)
Pre visit review using our clinic review tool, if applicable. No additional management support is needed unless otherwise documented below in the visit note. 

## 2014-09-09 ENCOUNTER — Other Ambulatory Visit: Payer: Self-pay | Admitting: Internal Medicine

## 2014-09-09 DIAGNOSIS — R001 Bradycardia, unspecified: Secondary | ICD-10-CM | POA: Insufficient documentation

## 2014-09-09 DIAGNOSIS — N200 Calculus of kidney: Secondary | ICD-10-CM | POA: Insufficient documentation

## 2014-09-09 MED ORDER — METOPROLOL SUCCINATE ER 25 MG PO TB24: 12.5000 mg | ORAL_TABLET | Freq: Every day | ORAL | Status: DC

## 2014-09-09 NOTE — Assessment & Plan Note (Signed)
It could be responsible for intermittent dysuria. UA was nl. CT reviewed. Will watch. Pt declined Urol ref at the moment

## 2014-09-09 NOTE — Assessment & Plan Note (Signed)
We discussed age appropriate health related issues, including available/recomended screening tests and vaccinations. We discussed a need for adhering to healthy diet and exercise. Labs/EKG were reviewed/ordered. All questions were answered.   

## 2014-09-09 NOTE — Assessment & Plan Note (Signed)
Due to Metoprolol, asymptomatic Reduce Metoprolol to 1/2 tab a day

## 2014-09-09 NOTE — Assessment & Plan Note (Signed)
On B12 

## 2014-09-09 NOTE — Assessment & Plan Note (Signed)
On Toprol 

## 2014-09-10 NOTE — Telephone Encounter (Signed)
error 

## 2014-12-15 ENCOUNTER — Encounter: Payer: Self-pay | Admitting: Gastroenterology

## 2014-12-19 ENCOUNTER — Ambulatory Visit (INDEPENDENT_AMBULATORY_CARE_PROVIDER_SITE_OTHER): Payer: 59

## 2014-12-19 DIAGNOSIS — Z23 Encounter for immunization: Secondary | ICD-10-CM | POA: Diagnosis not present

## 2015-03-11 ENCOUNTER — Ambulatory Visit (INDEPENDENT_AMBULATORY_CARE_PROVIDER_SITE_OTHER): Payer: 59 | Admitting: Internal Medicine

## 2015-03-11 ENCOUNTER — Encounter: Payer: Self-pay | Admitting: Internal Medicine

## 2015-03-11 VITALS — BP 110/78 | HR 57 | Wt 194.0 lb

## 2015-03-11 DIAGNOSIS — Z0000 Encounter for general adult medical examination without abnormal findings: Secondary | ICD-10-CM

## 2015-03-11 DIAGNOSIS — E038 Other specified hypothyroidism: Secondary | ICD-10-CM

## 2015-03-11 DIAGNOSIS — E538 Deficiency of other specified B group vitamins: Secondary | ICD-10-CM | POA: Diagnosis not present

## 2015-03-11 DIAGNOSIS — I10 Essential (primary) hypertension: Secondary | ICD-10-CM

## 2015-03-11 DIAGNOSIS — E034 Atrophy of thyroid (acquired): Secondary | ICD-10-CM

## 2015-03-11 DIAGNOSIS — J0100 Acute maxillary sinusitis, unspecified: Secondary | ICD-10-CM

## 2015-03-11 MED ORDER — PROMETHAZINE-CODEINE 6.25-10 MG/5ML PO SYRP: 5.0000 mL | ORAL_SOLUTION | ORAL | Status: DC | PRN

## 2015-03-11 MED ORDER — CEFUROXIME AXETIL 500 MG PO TABS: 500.0000 mg | ORAL_TABLET | Freq: Two times a day (BID) | ORAL | Status: DC

## 2015-03-11 NOTE — Assessment & Plan Note (Signed)
Ceftin 500 mg po bid x 10 d Prom-cod syr prn

## 2015-03-11 NOTE — Progress Notes (Signed)
Subjective:  Patient ID: Shane Short, male    DOB: Jan 30, 1951  Age: 65 y.o. MRN: [id number]  CC: No chief complaint on file.   HPI Shane Short presents for cough and sinusitis x 2 weeks. F/u hypothyroidism, HTN, OA  Outpatient Prescriptions Prior to Visit  Medication Sig Dispense Refill  . aspirin 81 MG EC tablet Take 81 mg by mouth daily after breakfast.      . Cholecalciferol (VITAMIN D3) 1000 UNITS tablet Take 1,000 Units by mouth daily.      . cyanocobalamin 1000 MCG tablet Take 1,000 mcg by mouth every other day.     . levothyroxine (SYNTHROID, LEVOTHROID) 125 MCG tablet Take 1 tablet (125 mcg total) by mouth daily before breakfast. 90 tablet 3  . metoprolol succinate (TOPROL-XL) 25 MG 24 hr tablet Take 0.5 tablets (12.5 mg total) by mouth daily. 30 tablet 11  . saw palmetto 160 MG capsule 1 po qd 100 capsule 3  . metoprolol succinate (TOPROL-XL) 25 MG 24 hr tablet TAKE ONE TABLET BY MOUTH ONCE DAILY 90 tablet 3  . ibuprofen (ADVIL,MOTRIN) 800 MG tablet Take 1 tablet (800 mg total) by mouth 3 (three) times daily. (Patient not taking: Reported on 03/11/2015) 21 tablet 0   No facility-administered medications prior to visit.    ROS Review of Systems  Constitutional: Negative for appetite change, fatigue and unexpected weight change.  HENT: Positive for congestion and sinus pressure. Negative for nosebleeds, sneezing, sore throat and trouble swallowing.   Eyes: Negative for itching and visual disturbance.  Respiratory: Positive for cough.   Cardiovascular: Negative for chest pain, palpitations and leg swelling.  Gastrointestinal: Negative for nausea, diarrhea, blood in stool and abdominal distention.  Genitourinary: Negative for frequency and hematuria.  Musculoskeletal: Negative for back pain, joint swelling, gait problem and neck pain.  Skin: Negative for rash.  Neurological: Negative for dizziness, tremors, speech difficulty and weakness.  Psychiatric/Behavioral:  Negative for sleep disturbance, dysphoric mood and agitation. The patient is not nervous/anxious.     Objective:  BP 110/78 mmHg  Pulse 57  Wt 194 lb (87.998 kg)  SpO2 96%  BP Readings from Last 3 Encounters:  03/11/15 110/78  09/08/14 110/68  08/04/14 120/68    Wt Readings from Last 3 Encounters:  03/11/15 194 lb (87.998 kg)  09/08/14 183 lb (83.008 kg)  03/10/14 205 lb (92.987 kg)    Physical Exam  Constitutional: He is oriented to person, place, and time. He appears well-developed. No distress.  NAD  HENT:  Mouth/Throat: Oropharynx is clear and moist.  Eyes: Conjunctivae are normal. Pupils are equal, round, and reactive to light.  Neck: Normal range of motion. No JVD present. No thyromegaly present.  Cardiovascular: Normal rate, regular rhythm, normal heart sounds and intact distal pulses.  Exam reveals no gallop and no friction rub.   No murmur heard. Pulmonary/Chest: Effort normal and breath sounds normal. No respiratory distress. He has no wheezes. He has no rales. He exhibits no tenderness.  Abdominal: Soft. Bowel sounds are normal. He exhibits no distension and no mass. There is no tenderness. There is no rebound and no guarding.  Musculoskeletal: Normal range of motion. He exhibits no edema or tenderness.  Lymphadenopathy:    He has no cervical adenopathy.  Neurological: He is alert and oriented to person, place, and time. He has normal reflexes. No cranial nerve deficit. He exhibits normal muscle tone. He displays a negative Romberg sign. Coordination and gait normal.  Skin: Skin  is warm and dry. No rash noted.  Psychiatric: He has a normal mood and affect. His behavior is normal. Judgment and thought content normal.  eryth nasal mucosa  Lab Results  Component Value Date   WBC 5.4 09/08/2014   HGB 13.5 09/08/2014   HCT 39.8 09/08/2014   PLT 199.0 09/08/2014   GLUCOSE 91 09/08/2014   CHOL 158 09/08/2014   TRIG 118.0 09/08/2014   HDL 33.70* 09/08/2014    LDLCALC 101* 09/08/2014   ALT 16 09/08/2014   AST 19 09/08/2014   NA 140 09/08/2014   K 4.8 09/08/2014   CL 105 09/08/2014   CREATININE 1.03 09/08/2014   BUN 13 09/08/2014   CO2 28 09/08/2014   TSH 0.71 09/08/2014   PSA 0.34 09/08/2014    Ct Renal Stone Study  08/04/2014  CLINICAL DATA:  Flank pain. Symptoms have progressively become worse. Patient has history of kidney stones. EXAM: CT ABDOMEN AND PELVIS WITHOUT CONTRAST TECHNIQUE: Multidetector CT imaging of the abdomen and pelvis was performed following the standard protocol without IV contrast. COMPARISON:  None. FINDINGS: Marked LEFT hydronephrosis and hydroureter. 3 mm LEFT UVJ stone. Small BILATERAL intrarenal calculi. No right-sided obstruction. Within limits for evaluation in the noncontrast state, no intra-abdominal visceral abnormality. Unremarkable bowel. No free fluid or free air. No skeletal abnormalities. Mild vascular calcification. Visualized lung bases clear. IMPRESSION: BILATERAL nephrolithiasis. LEFT hydronephrosis and hydroureter secondary to a 3 mm stone LEFT UVJ. Electronically Signed   By: Rolla Flatten M.D.   On: 08/04/2014 00:08    Assessment & Plan:   There are no diagnoses linked to this encounter. I am having Mr. Adkins start on promethazine-codeine and cefUROXime. I am also having him maintain his aspirin, cholecalciferol, cyanocobalamin, ibuprofen, levothyroxine, saw palmetto, and metoprolol succinate.  Meds ordered this encounter  Medications  . promethazine-codeine (PHENERGAN WITH CODEINE) 6.25-10 MG/5ML syrup    Sig: Take 5 mLs by mouth every 4 (four) hours as needed.    Dispense:  300 mL    Refill:  0  . cefUROXime (CEFTIN) 500 MG tablet    Sig: Take 1 tablet (500 mg total) by mouth 2 (two) times daily.    Dispense:  20 tablet    Refill:  0     Follow-up: Return in about 6 months (around 09/08/2015) for Wellness Exam.  Walker Kehr, MD

## 2015-03-11 NOTE — Assessment & Plan Note (Signed)
Chronic On Levothroid - 

## 2015-03-11 NOTE — Assessment & Plan Note (Signed)
On Toprol 

## 2015-03-11 NOTE — Progress Notes (Signed)
Pre visit review using our clinic review tool, if applicable. No additional management support is needed unless otherwise documented below in the visit note. 

## 2015-03-11 NOTE — Assessment & Plan Note (Signed)
On Vit B12 

## 2015-06-10 ENCOUNTER — Other Ambulatory Visit: Payer: Self-pay | Admitting: Internal Medicine

## 2015-09-07 ENCOUNTER — Other Ambulatory Visit (INDEPENDENT_AMBULATORY_CARE_PROVIDER_SITE_OTHER): Payer: 59

## 2015-09-07 DIAGNOSIS — Z0000 Encounter for general adult medical examination without abnormal findings: Secondary | ICD-10-CM

## 2015-09-07 LAB — CBC WITH DIFFERENTIAL/PLATELET
BASOS PCT: 0.8 % (ref 0.0–3.0)
Basophils Absolute: 0 10*3/uL (ref 0.0–0.1)
EOS ABS: 0.4 10*3/uL (ref 0.0–0.7)
Eosinophils Relative: 7 % — ABNORMAL HIGH (ref 0.0–5.0)
HEMATOCRIT: 38.6 % — AB (ref 39.0–52.0)
HEMOGLOBIN: 13.4 g/dL (ref 13.0–17.0)
LYMPHS PCT: 37 % (ref 12.0–46.0)
Lymphs Abs: 1.9 10*3/uL (ref 0.7–4.0)
MCHC: 34.6 g/dL (ref 30.0–36.0)
MCV: 84.7 fl (ref 78.0–100.0)
Monocytes Absolute: 0.4 10*3/uL (ref 0.1–1.0)
Monocytes Relative: 8.6 % (ref 3.0–12.0)
Neutro Abs: 2.4 10*3/uL (ref 1.4–7.7)
Neutrophils Relative %: 46.6 % (ref 43.0–77.0)
Platelets: 195 10*3/uL (ref 150.0–400.0)
RBC: 4.56 Mil/uL (ref 4.22–5.81)
RDW: 12.6 % (ref 11.5–15.5)
WBC: 5.1 10*3/uL (ref 4.0–10.5)

## 2015-09-07 LAB — LIPID PANEL
CHOLESTEROL: 165 mg/dL (ref 0–200)
HDL: 33.9 mg/dL — ABNORMAL LOW (ref >39.00)
LDL Cholesterol: 102 mg/dL — ABNORMAL HIGH (ref 0–99)
NONHDL: 130.99
Total CHOL/HDL Ratio: 5
Triglycerides: 143 mg/dL (ref 0.0–149.0)
VLDL: 28.6 mg/dL (ref 0.0–40.0)

## 2015-09-07 LAB — URINALYSIS
Bilirubin Urine: NEGATIVE
Hgb urine dipstick: NEGATIVE
KETONES UR: NEGATIVE
Leukocytes, UA: NEGATIVE
Nitrite: NEGATIVE
PH: 6 (ref 5.0–8.0)
SPECIFIC GRAVITY, URINE: 1.025 (ref 1.000–1.030)
TOTAL PROTEIN, URINE-UPE24: NEGATIVE
URINE GLUCOSE: NEGATIVE
UROBILINOGEN UA: 0.2 (ref 0.0–1.0)

## 2015-09-07 LAB — BASIC METABOLIC PANEL
BUN: 15 mg/dL (ref 6–23)
CALCIUM: 9.5 mg/dL (ref 8.4–10.5)
CO2: 31 mEq/L (ref 19–32)
CREATININE: 1.03 mg/dL (ref 0.40–1.50)
Chloride: 104 mEq/L (ref 96–112)
GFR: 77.27 mL/min (ref >60.00)
Glucose, Bld: 98 mg/dL (ref 70–99)
Potassium: 4.5 mEq/L (ref 3.5–5.1)
SODIUM: 141 meq/L (ref 135–145)

## 2015-09-07 LAB — HEPATIC FUNCTION PANEL
ALK PHOS: 62 U/L (ref 39–117)
ALT: 16 U/L (ref 0–53)
AST: 18 U/L (ref 0–37)
Albumin: 4.3 g/dL (ref 3.5–5.2)
BILIRUBIN DIRECT: 0.1 mg/dL (ref 0.0–0.3)
TOTAL PROTEIN: 7.1 g/dL (ref 6.0–8.3)
Total Bilirubin: 0.6 mg/dL (ref 0.2–1.2)

## 2015-09-07 LAB — TSH: TSH: 1.76 u[IU]/mL (ref 0.35–4.50)

## 2015-09-07 LAB — HEPATITIS C ANTIBODY: HCV AB: NEGATIVE

## 2015-09-07 LAB — PSA: PSA: 0.37 ng/mL (ref 0.10–4.00)

## 2015-09-08 ENCOUNTER — Encounter: Payer: Self-pay | Admitting: Internal Medicine

## 2015-09-08 ENCOUNTER — Ambulatory Visit (INDEPENDENT_AMBULATORY_CARE_PROVIDER_SITE_OTHER): Payer: 59 | Admitting: Internal Medicine

## 2015-09-08 VITALS — BP 130/90 | HR 52 | Ht 69.0 in | Wt 195.0 lb

## 2015-09-08 DIAGNOSIS — E538 Deficiency of other specified B group vitamins: Secondary | ICD-10-CM | POA: Diagnosis not present

## 2015-09-08 DIAGNOSIS — H6690 Otitis media, unspecified, unspecified ear: Secondary | ICD-10-CM | POA: Insufficient documentation

## 2015-09-08 DIAGNOSIS — H6592 Unspecified nonsuppurative otitis media, left ear: Secondary | ICD-10-CM

## 2015-09-08 DIAGNOSIS — L570 Actinic keratosis: Secondary | ICD-10-CM | POA: Diagnosis not present

## 2015-09-08 DIAGNOSIS — Z0000 Encounter for general adult medical examination without abnormal findings: Secondary | ICD-10-CM | POA: Diagnosis not present

## 2015-09-08 MED ORDER — CEFDINIR 300 MG PO CAPS: 300.0000 mg | ORAL_CAPSULE | Freq: Two times a day (BID) | ORAL | Status: DC

## 2015-09-08 NOTE — Progress Notes (Signed)
Subjective:  Patient ID: Shane Short, male    DOB: 1950/11/30  Age: 65 y.o. MRN: [id number]  CC: Annual Exam   HPI Shane Short presents for a well exam. C/o B ears popping x 2 weeks L>R. C/o rough skin spot on the R arm.. He had a cold 2-3 weeks ago  Outpatient Prescriptions Prior to Visit  Medication Sig Dispense Refill  . aspirin 81 MG EC tablet Take 81 mg by mouth daily after breakfast.      . Cholecalciferol (VITAMIN D3) 1000 UNITS tablet Take 1,000 Units by mouth daily.      . cyanocobalamin 1000 MCG tablet Take 1,000 mcg by mouth every other day.     . levothyroxine (SYNTHROID, LEVOTHROID) 125 MCG tablet TAKE ONE TABLET BY MOUTH ONCE DAILY BEFORE  BREAKFAST 90 tablet 1  . metoprolol succinate (TOPROL-XL) 25 MG 24 hr tablet Take 0.5 tablets (12.5 mg total) by mouth daily. 30 tablet 11  . saw palmetto 160 MG capsule 1 po qd 100 capsule 3  . ibuprofen (ADVIL,MOTRIN) 800 MG tablet Take 1 tablet (800 mg total) by mouth 3 (three) times daily. (Patient not taking: Reported on 09/08/2015) 21 tablet 0  . promethazine-codeine (PHENERGAN WITH CODEINE) 6.25-10 MG/5ML syrup Take 5 mLs by mouth every 4 (four) hours as needed. (Patient not taking: Reported on 09/08/2015) 300 mL 0  . cefUROXime (CEFTIN) 500 MG tablet Take 1 tablet (500 mg total) by mouth 2 (two) times daily. (Patient not taking: Reported on 09/08/2015) 20 tablet 0   No facility-administered medications prior to visit.    ROS Review of Systems  Constitutional: Negative for appetite change, fatigue and unexpected weight change.  HENT: Positive for congestion and ear pain. Negative for ear discharge, nosebleeds, sneezing, sore throat and trouble swallowing.   Eyes: Negative for itching and visual disturbance.  Respiratory: Negative for cough.   Cardiovascular: Negative for chest pain, palpitations and leg swelling.  Gastrointestinal: Negative for nausea, diarrhea, blood in stool and abdominal distention.  Genitourinary:  Negative for frequency and hematuria.  Musculoskeletal: Negative for back pain, joint swelling, gait problem and neck pain.  Skin: Negative for rash.  Neurological: Negative for dizziness, tremors, speech difficulty and weakness.  Psychiatric/Behavioral: Negative for sleep disturbance, dysphoric mood and agitation. The patient is not nervous/anxious.     Objective:  BP 130/90 mmHg  Pulse 52  Ht 5\' 9"  (1.753 m)  Wt 195 lb (88.451 kg)  BMI 28.78 kg/m2  SpO2 97%  BP Readings from Last 3 Encounters:  09/08/15 130/90  03/11/15 110/78  09/08/14 110/68    Wt Readings from Last 3 Encounters:  09/08/15 195 lb (88.451 kg)  03/11/15 194 lb (87.998 kg)  09/08/14 183 lb (83.008 kg)    Physical Exam  Constitutional: He is oriented to person, place, and time. He appears well-developed. No distress.  NAD  HENT:  Mouth/Throat: Oropharynx is clear and moist.  Eyes: Conjunctivae are normal. Pupils are equal, round, and reactive to light.  Neck: Normal range of motion. No JVD present. No thyromegaly present.  Cardiovascular: Normal rate, regular rhythm, normal heart sounds and intact distal pulses.  Exam reveals no gallop and no friction rub.   No murmur heard. Pulmonary/Chest: Effort normal and breath sounds normal. No respiratory distress. He has no wheezes. He has no rales. He exhibits no tenderness.  Abdominal: Soft. Bowel sounds are normal. He exhibits no distension and no mass. There is no tenderness. There is no rebound and no  guarding.  Musculoskeletal: Normal range of motion. He exhibits no edema or tenderness.  Lymphadenopathy:    He has no cervical adenopathy.  Neurological: He is alert and oriented to person, place, and time. He has normal reflexes. No cranial nerve deficit. He exhibits normal muscle tone. He displays a negative Romberg sign. Coordination and gait normal.  Skin: Skin is warm and dry. No rash noted.  Psychiatric: He has a normal mood and affect. His behavior is  normal. Judgment and thought content normal.   R elbow AK L TM dull   Procedure Note :     Procedure : Cryosurgery   Indication:  Actinic keratosis(es)   Risks including unsuccessful procedure , bleeding, infection, bruising, scar, a need for a repeat  procedure and others were explained to the patient in detail as well as the benefits. Informed consent was obtained verbally.   1  lesion(s)  on R elbow   was/were treated with liquid nitrogen on a Q-tip in a usual fasion . Band-Aid was applied and antibiotic ointment was given for a later use.   Tolerated well. Complications none.   Postprocedure instructions :     Keep the wounds clean. You can wash them with liquid soap and water. Pat dry with gauze or a Kleenex tissue  Before applying antibiotic ointment and a Band-Aid.   You need to report immediately  if  any signs of infection develop.    Lab Results  Component Value Date   WBC 5.1 09/07/2015   HGB 13.4 09/07/2015   HCT 38.6* 09/07/2015   PLT 195.0 09/07/2015   GLUCOSE 98 09/07/2015   CHOL 165 09/07/2015   TRIG 143.0 09/07/2015   HDL 33.90* 09/07/2015   LDLCALC 102* 09/07/2015   ALT 16 09/07/2015   AST 18 09/07/2015   NA 141 09/07/2015   K 4.5 09/07/2015   CL 104 09/07/2015   CREATININE 1.03 09/07/2015   BUN 15 09/07/2015   CO2 31 09/07/2015   TSH 1.76 09/07/2015   PSA 0.37 09/07/2015    Ct Renal Stone Study  08/04/2014  CLINICAL DATA:  Flank pain. Symptoms have progressively become worse. Patient has history of kidney stones. EXAM: CT ABDOMEN AND PELVIS WITHOUT CONTRAST TECHNIQUE: Multidetector CT imaging of the abdomen and pelvis was performed following the standard protocol without IV contrast. COMPARISON:  None. FINDINGS: Marked LEFT hydronephrosis and hydroureter. 3 mm LEFT UVJ stone. Small BILATERAL intrarenal calculi. No right-sided obstruction. Within limits for evaluation in the noncontrast state, no intra-abdominal visceral abnormality. Unremarkable  bowel. No free fluid or free air. No skeletal abnormalities. Mild vascular calcification. Visualized lung bases clear. IMPRESSION: BILATERAL nephrolithiasis. LEFT hydronephrosis and hydroureter secondary to a 3 mm stone LEFT UVJ. Electronically Signed   By: Rolla Flatten M.D.   On: 08/04/2014 00:08    Assessment & Plan:   There are no diagnoses linked to this encounter. I have discontinued Mr. Ellenberger's cefUROXime. I am also having him maintain his aspirin, cholecalciferol, cyanocobalamin, ibuprofen, saw palmetto, metoprolol succinate, promethazine-codeine, and levothyroxine.  No orders of the defined types were placed in this encounter.     Follow-up: No Follow-up on file.  Walker Kehr, MD

## 2015-09-08 NOTE — Progress Notes (Signed)
Pre visit review using our clinic review tool, if applicable. No additional management support is needed unless otherwise documented below in the visit note. 

## 2015-09-08 NOTE — Assessment & Plan Note (Signed)
L ear Start [employer]

## 2015-09-08 NOTE — Assessment & Plan Note (Signed)
On B12 

## 2015-09-08 NOTE — Patient Instructions (Signed)
   Postprocedure instructions :     Keep the wounds clean. You can wash them with liquid soap and water. Pat dry with gauze or a Kleenex tissue  Before applying antibiotic ointment and a Band-Aid.   You need to report immediately  if  any signs of infection develop.    

## 2015-09-08 NOTE — Assessment & Plan Note (Signed)
We discussed age appropriate health related issues, including available/recomended screening tests and vaccinations. We discussed a need for adhering to healthy diet and exercise. Labs/EKG were reviewed/ordered. All questions were answered.   

## 2015-09-08 NOTE — Assessment & Plan Note (Addendum)
Options discussed See Cryo

## 2015-09-11 ENCOUNTER — Other Ambulatory Visit: Payer: Self-pay | Admitting: Internal Medicine

## 2015-11-24 ENCOUNTER — Ambulatory Visit (INDEPENDENT_AMBULATORY_CARE_PROVIDER_SITE_OTHER): Payer: 59

## 2015-11-24 DIAGNOSIS — Z23 Encounter for immunization: Secondary | ICD-10-CM | POA: Diagnosis not present

## 2015-12-09 ENCOUNTER — Other Ambulatory Visit: Payer: Self-pay | Admitting: Internal Medicine

## 2016-03-10 ENCOUNTER — Encounter: Payer: Self-pay | Admitting: Internal Medicine

## 2016-03-10 ENCOUNTER — Other Ambulatory Visit (INDEPENDENT_AMBULATORY_CARE_PROVIDER_SITE_OTHER): Payer: 59

## 2016-03-10 ENCOUNTER — Ambulatory Visit (INDEPENDENT_AMBULATORY_CARE_PROVIDER_SITE_OTHER): Payer: 59 | Admitting: Internal Medicine

## 2016-03-10 VITALS — BP 120/84 | HR 56 | Wt 202.0 lb

## 2016-03-10 DIAGNOSIS — E538 Deficiency of other specified B group vitamins: Secondary | ICD-10-CM | POA: Diagnosis not present

## 2016-03-10 DIAGNOSIS — I10 Essential (primary) hypertension: Secondary | ICD-10-CM | POA: Diagnosis not present

## 2016-03-10 DIAGNOSIS — E785 Hyperlipidemia, unspecified: Secondary | ICD-10-CM

## 2016-03-10 DIAGNOSIS — E034 Atrophy of thyroid (acquired): Secondary | ICD-10-CM

## 2016-03-10 LAB — BASIC METABOLIC PANEL
BUN: 14 mg/dL (ref 6–23)
CALCIUM: 9.4 mg/dL (ref 8.4–10.5)
CO2: 27 mEq/L (ref 19–32)
CREATININE: 1.04 mg/dL (ref 0.40–1.50)
Chloride: 103 mEq/L (ref 96–112)
GFR: 76.29 mL/min (ref >60.00)
GLUCOSE: 87 mg/dL (ref 70–99)
Potassium: 4.1 mEq/L (ref 3.5–5.1)
Sodium: 139 mEq/L (ref 135–145)

## 2016-03-10 LAB — TSH: TSH: 2.93 u[IU]/mL (ref 0.35–4.50)

## 2016-03-10 NOTE — Assessment & Plan Note (Signed)
On B12 

## 2016-03-10 NOTE — Progress Notes (Signed)
Pre visit review using our clinic review tool, if applicable. No additional management support is needed unless otherwise documented below in the visit note. 

## 2016-03-10 NOTE — Assessment & Plan Note (Signed)
Toprol 

## 2016-03-10 NOTE — Assessment & Plan Note (Signed)
On Levothroid 

## 2016-03-10 NOTE — Progress Notes (Signed)
Subjective:  Patient ID: Shane Short, male    DOB: 16-Jan-1951  Age: 66 y.o. MRN: [id number]  CC: No chief complaint on file.   HPI Shane Short presents for hypothyroidism, HTN, BPH f/u  Outpatient Medications Prior to Visit  Medication Sig Dispense Refill  . aspirin 81 MG EC tablet Take 81 mg by mouth daily after breakfast.      . Cholecalciferol (VITAMIN D3) 1000 UNITS tablet Take 1,000 Units by mouth daily.      . cyanocobalamin 1000 MCG tablet Take 1,000 mcg by mouth every other day.     . levothyroxine (SYNTHROID, LEVOTHROID) 125 MCG tablet TAKE ONE TABLET BY MOUTH ONCE DAILY BEFORE  BREAKFAST 90 tablet 1  . metoprolol succinate (TOPROL-XL) 25 MG 24 hr tablet Take 0.5 tablets (12.5 mg total) by mouth daily. 30 tablet 11  . saw palmetto 160 MG capsule 1 po qd 100 capsule 3  . cefdinir (OMNICEF) 300 MG capsule Take 1 capsule (300 mg total) by mouth 2 (two) times daily. 20 capsule 0  . metoprolol succinate (TOPROL-XL) 25 MG 24 hr tablet TAKE ONE TABLET BY MOUTH ONCE DAILY 90 tablet 3  . ibuprofen (ADVIL,MOTRIN) 800 MG tablet Take 1 tablet (800 mg total) by mouth 3 (three) times daily. (Patient not taking: Reported on 03/10/2016) 21 tablet 0  . promethazine-codeine (PHENERGAN WITH CODEINE) 6.25-10 MG/5ML syrup Take 5 mLs by mouth every 4 (four) hours as needed. (Patient not taking: Reported on 03/10/2016) 300 mL 0   No facility-administered medications prior to visit.     ROS Review of Systems  Constitutional: Negative for appetite change, fatigue and unexpected weight change.  HENT: Negative for congestion, nosebleeds, sneezing, sore throat and trouble swallowing.   Eyes: Negative for itching and visual disturbance.  Respiratory: Negative for cough.   Cardiovascular: Negative for chest pain, palpitations and leg swelling.  Gastrointestinal: Negative for abdominal distention, blood in stool, diarrhea and nausea.  Genitourinary: Negative for frequency and hematuria.    Musculoskeletal: Negative for back pain, gait problem, joint swelling and neck pain.  Skin: Negative for rash.  Neurological: Negative for dizziness, tremors, speech difficulty and weakness.  Psychiatric/Behavioral: Negative for agitation, dysphoric mood and sleep disturbance. The patient is not nervous/anxious.     Objective:  BP 120/84   Pulse (!) 56   Wt 202 lb (91.6 kg)   SpO2 96%   BMI 29.83 kg/m   BP Readings from Last 3 Encounters:  03/10/16 120/84  09/08/15 130/90  03/11/15 110/78    Wt Readings from Last 3 Encounters:  03/10/16 202 lb (91.6 kg)  09/08/15 195 lb (88.5 kg)  03/11/15 194 lb (88 kg)    Physical Exam  Constitutional: He is oriented to person, place, and time. He appears well-developed. No distress.  NAD  HENT:  Mouth/Throat: Oropharynx is clear and moist.  Eyes: Conjunctivae are normal. Pupils are equal, round, and reactive to light.  Neck: Normal range of motion. No JVD present. No thyromegaly present.  Cardiovascular: Normal rate, regular rhythm, normal heart sounds and intact distal pulses.  Exam reveals no gallop and no friction rub.   No murmur heard. Pulmonary/Chest: Effort normal and breath sounds normal. No respiratory distress. He has no wheezes. He has no rales. He exhibits no tenderness.  Abdominal: Soft. Bowel sounds are normal. He exhibits no distension and no mass. There is no tenderness. There is no rebound and no guarding.  Musculoskeletal: Normal range of motion. He exhibits no edema  or tenderness.  Lymphadenopathy:    He has no cervical adenopathy.  Neurological: He is alert and oriented to person, place, and time. He has normal reflexes. No cranial nerve deficit. He exhibits normal muscle tone. He displays a negative Romberg sign. Coordination and gait normal.  Skin: Skin is warm and dry. No rash noted.  Psychiatric: He has a normal mood and affect. His behavior is normal. Judgment and thought content normal.    Lab Results   Component Value Date   WBC 5.1 09/07/2015   HGB 13.4 09/07/2015   HCT 38.6 (L) 09/07/2015   PLT 195.0 09/07/2015   GLUCOSE 98 09/07/2015   CHOL 165 09/07/2015   TRIG 143.0 09/07/2015   HDL 33.90 (L) 09/07/2015   LDLCALC 102 (H) 09/07/2015   ALT 16 09/07/2015   AST 18 09/07/2015   NA 141 09/07/2015   K 4.5 09/07/2015   CL 104 09/07/2015   CREATININE 1.03 09/07/2015   BUN 15 09/07/2015   CO2 31 09/07/2015   TSH 1.76 09/07/2015   PSA 0.37 09/07/2015    Ct Renal Stone Study  Result Date: 08/04/2014 CLINICAL DATA:  Flank pain. Symptoms have progressively become worse. Patient has history of kidney stones. EXAM: CT ABDOMEN AND PELVIS WITHOUT CONTRAST TECHNIQUE: Multidetector CT imaging of the abdomen and pelvis was performed following the standard protocol without IV contrast. COMPARISON:  None. FINDINGS: Marked LEFT hydronephrosis and hydroureter. 3 mm LEFT UVJ stone. Small BILATERAL intrarenal calculi. No right-sided obstruction. Within limits for evaluation in the noncontrast state, no intra-abdominal visceral abnormality. Unremarkable bowel. No free fluid or free air. No skeletal abnormalities. Mild vascular calcification. Visualized lung bases clear. IMPRESSION: BILATERAL nephrolithiasis. LEFT hydronephrosis and hydroureter secondary to a 3 mm stone LEFT UVJ. Electronically Signed   By: Rolla Flatten M.D.   On: 08/04/2014 00:08    Assessment & Plan:   There are no diagnoses linked to this encounter. I have discontinued Mr. Brann's cefdinir. I am also having him maintain his aspirin, cholecalciferol, cyanocobalamin, ibuprofen, saw palmetto, metoprolol succinate, promethazine-codeine, and levothyroxine.  No orders of the defined types were placed in this encounter.    Follow-up: No Follow-up on file.  Walker Kehr, MD

## 2016-03-10 NOTE — Assessment & Plan Note (Signed)
Labs in 6 mo

## 2016-06-03 ENCOUNTER — Other Ambulatory Visit: Payer: Self-pay | Admitting: Internal Medicine

## 2016-08-20 ENCOUNTER — Telehealth: Payer: 59 | Admitting: Physician Assistant

## 2016-08-20 DIAGNOSIS — B029 Zoster without complications: Secondary | ICD-10-CM

## 2016-08-20 NOTE — Progress Notes (Signed)
E-visit for Shingles   We are sorry that you are not feeling well. Here is how we plan to help!  Based on what you shared with me it looks like you have shingles.  Shingles or herpes zoster, is a common infection of the nerves.  It is a painful rash caused by the herpes zoster virus.  This is the same virus that causes chickenpox.  After a person has chickenpox, the virus remains inactive in the nerve cells.  Years later, the virus can become active again and travel to the skin.  It typically will appear on one side of the face or body.  Burning or shooting pain, tingling, or itching are early signs of the infection.  Blisters typically scab over in 7 to 10 days and clear up within 2-4 weeks. Shingles is only contagious to people that have never had the chickenpox, the chickenpox vaccine, or anyone who has a compromised immune system.  You should avoid contact with these type of people until your blisters scab over.  As you have had symptoms for a week, antiviral medications are unlikely to be of any major benefit. Your body will have to continue getting control of the virus itself. You may require analgesics (pain medications) that are stronger that what I can give you via an e-visit. I would recommend you be seen at an Urgent Care or ER today so you can get the appropriate pain medications to help you feel better while we are waiting for the rash to resolve.  HOME CARE: . Apply ice packs (wrapped in a thin towel), cool compresses, or soak in cool bath to help reduce pain. . Use calamine lotion to calm itchy skin. . Avoid scratching the rash. . Avoid direct sunlight.  GET HELP RIGHT AWAY IF: . Symptoms that don't away after treatment. . A rash or blisters near your eye. . Increased drainage, fever, or rash after treatment. . Severe pain that doesn't go away.   MAKE SURE YOU    Understand these instructions.  Will watch your condition.  Will get help right away if you are not doing well  or get worse.  Thank you for choosing an e-visit. Your e-visit answers were reviewed by a board certified advanced clinical practitioner to complete your personal care plan. Depending upon the condition, your plan could have included both over the counter or prescription medications.  Please review your pharmacy choice. Make sure the pharmacy is open so you can pick up prescription now. If there is a problem, you may contact your provider through [employer] and have the prescription routed to another pharmacy.  Your safety is important to [place]. If you have drug allergies check your prescription carefully.   For the next 24 hours you can use MyChart to ask questions about today's visit, request a non-urgent call back, or ask for a work or school excuse.  You will get an email in the next two days asking about your experience. I hope that your e-visit has been valuable and will speed your recovery

## 2016-09-08 ENCOUNTER — Encounter: Payer: Self-pay | Admitting: Internal Medicine

## 2016-09-08 ENCOUNTER — Other Ambulatory Visit (INDEPENDENT_AMBULATORY_CARE_PROVIDER_SITE_OTHER): Payer: 59

## 2016-09-08 ENCOUNTER — Ambulatory Visit (INDEPENDENT_AMBULATORY_CARE_PROVIDER_SITE_OTHER): Payer: 59 | Admitting: Internal Medicine

## 2016-09-08 VITALS — BP 122/76 | HR 53 | Temp 98.1°F | Ht 69.0 in | Wt 202.0 lb

## 2016-09-08 DIAGNOSIS — B029 Zoster without complications: Secondary | ICD-10-CM

## 2016-09-08 DIAGNOSIS — Z0000 Encounter for general adult medical examination without abnormal findings: Secondary | ICD-10-CM

## 2016-09-08 DIAGNOSIS — E538 Deficiency of other specified B group vitamins: Secondary | ICD-10-CM

## 2016-09-08 LAB — LIPID PANEL
Cholesterol: 172 mg/dL (ref 0–200)
HDL: 34.9 mg/dL — ABNORMAL LOW (ref >39.00)
LDL Cholesterol: 106 mg/dL — ABNORMAL HIGH (ref 0–99)
NONHDL: 137.58
Total CHOL/HDL Ratio: 5
Triglycerides: 158 mg/dL — ABNORMAL HIGH (ref 0.0–149.0)
VLDL: 31.6 mg/dL (ref 0.0–40.0)

## 2016-09-08 LAB — CBC WITH DIFFERENTIAL/PLATELET
BASOS ABS: 0 10*3/uL (ref 0.0–0.1)
Basophils Relative: 0.6 % (ref 0.0–3.0)
EOS PCT: 6.2 % — AB (ref 0.0–5.0)
Eosinophils Absolute: 0.4 10*3/uL (ref 0.0–0.7)
HEMATOCRIT: 40.5 % (ref 39.0–52.0)
HEMOGLOBIN: 14.1 g/dL (ref 13.0–17.0)
LYMPHS ABS: 2 10*3/uL (ref 0.7–4.0)
LYMPHS PCT: 32.3 % (ref 12.0–46.0)
MCHC: 34.8 g/dL (ref 30.0–36.0)
MCV: 84.5 fl (ref 78.0–100.0)
MONOS PCT: 9.2 % (ref 3.0–12.0)
Monocytes Absolute: 0.6 10*3/uL (ref 0.1–1.0)
NEUTROS PCT: 51.7 % (ref 43.0–77.0)
Neutro Abs: 3.3 10*3/uL (ref 1.4–7.7)
Platelets: 191 10*3/uL (ref 150.0–400.0)
RBC: 4.79 Mil/uL (ref 4.22–5.81)
RDW: 13.1 % (ref 11.5–15.5)
WBC: 6.3 10*3/uL (ref 4.0–10.5)

## 2016-09-08 LAB — BASIC METABOLIC PANEL
BUN: 17 mg/dL (ref 6–23)
CHLORIDE: 104 meq/L (ref 96–112)
CO2: 28 mEq/L (ref 19–32)
CREATININE: 1.05 mg/dL (ref 0.40–1.50)
Calcium: 9.5 mg/dL (ref 8.4–10.5)
GFR: 75.33 mL/min (ref >60.00)
GLUCOSE: 96 mg/dL (ref 70–99)
POTASSIUM: 4.1 meq/L (ref 3.5–5.1)
Sodium: 139 mEq/L (ref 135–145)

## 2016-09-08 LAB — HEPATIC FUNCTION PANEL
ALBUMIN: 4.3 g/dL (ref 3.5–5.2)
ALK PHOS: 68 U/L (ref 39–117)
ALT: 20 U/L (ref 0–53)
AST: 21 U/L (ref 0–37)
Bilirubin, Direct: 0.1 mg/dL (ref 0.0–0.3)
Total Bilirubin: 0.6 mg/dL (ref 0.2–1.2)
Total Protein: 7.5 g/dL (ref 6.0–8.3)

## 2016-09-08 LAB — PSA: PSA: 0.5 ng/mL (ref 0.10–4.00)

## 2016-09-08 LAB — URINALYSIS
BILIRUBIN URINE: NEGATIVE
Hgb urine dipstick: NEGATIVE
KETONES UR: NEGATIVE
Leukocytes, UA: NEGATIVE
Nitrite: NEGATIVE
PH: 6.5 (ref 5.0–8.0)
SPECIFIC GRAVITY, URINE: 1.02 (ref 1.000–1.030)
Total Protein, Urine: NEGATIVE
UROBILINOGEN UA: 0.2 (ref 0.0–1.0)
Urine Glucose: NEGATIVE

## 2016-09-08 LAB — TSH: TSH: 2.7 u[IU]/mL (ref 0.35–4.50)

## 2016-09-08 MED ORDER — ZOSTER VAC RECOMB ADJUVANTED 50 MCG/0.5ML IM SUSR: 0.5000 mL | Freq: Once | INTRAMUSCULAR | 1 refills | Status: AC

## 2016-09-08 NOTE — Assessment & Plan Note (Signed)
Here for medicare wellness/physical  Diet: heart healthy  Physical activity: not sedentary  Depression/mood screen: negative  Hearing: intact to whispered voice  Visual acuity: grossly normal w/glasses, performs annual eye exam  ADLs: capable  Fall risk: low to none  Home safety: good  Cognitive evaluation: intact to orientation, naming, recall and repetition  EOL planning: adv directives, full code/ I agree  I have personally reviewed and have noted  1. The patient's medical, surgical and social history  2. Their use of alcohol, tobacco or illicit drugs  3. Their current medications and supplements  4. The patient's functional ability including ADL's, fall risks, home safety risks and hearing or visual impairment.  5. Diet and physical activities  6. Evidence for depression or mood disorders 7. The roster of all physicians providing medical care to patient - is listed in the Snapshot section of the chart and reviewed today.    Today patient counseled on age appropriate routine health concerns for screening and prevention, each reviewed and up to date or declined. Immunizations reviewed and up to date or declined. Labs ordered and reviewed. Risk factors for depression reviewed and negative. Hearing function and visual acuity are intact. ADLs screened and addressed as needed. Functional ability and level of safety reviewed and appropriate. Education, counseling and referrals performed based on assessed risks today. Patient provided with a copy of personalized plan for preventive services.   Colon 2011 Shingrix

## 2016-09-08 NOTE — Assessment & Plan Note (Signed)
Treated Shingrix in 2-3 mo

## 2016-09-08 NOTE — Assessment & Plan Note (Signed)
On B12 

## 2016-09-08 NOTE — Progress Notes (Signed)
Subjective:  Patient ID: Shane Short, male    DOB: Jan 02, 1951  Age: 66 y.o. MRN: [id number]  CC: No chief complaint on file.   HPI Shane Short presents for a well exam He had shingles 2 week  Outpatient Medications Prior to Visit  Medication Sig Dispense Refill  . aspirin 81 MG EC tablet Take 81 mg by mouth daily after breakfast.      . Cholecalciferol (VITAMIN D3) 1000 UNITS tablet Take 1,000 Units by mouth daily.      . cyanocobalamin 1000 MCG tablet Take 1,000 mcg by mouth every other day.     . levothyroxine (SYNTHROID, LEVOTHROID) 125 MCG tablet Take 1 tablet (125 mcg total) by mouth daily before breakfast. 90 tablet 1  . metoprolol succinate (TOPROL-XL) 25 MG 24 hr tablet Take 0.5 tablets (12.5 mg total) by mouth daily. 30 tablet 11  . saw palmetto 160 MG capsule 1 po qd 100 capsule 3  . ibuprofen (ADVIL,MOTRIN) 800 MG tablet Take 1 tablet (800 mg total) by mouth 3 (three) times daily. (Patient not taking: Reported on 03/10/2016) 21 tablet 0   No facility-administered medications prior to visit.     ROS Review of Systems  Constitutional: Negative for appetite change, fatigue and unexpected weight change.  HENT: Negative for congestion, nosebleeds, sneezing, sore throat and trouble swallowing.   Eyes: Negative for itching and visual disturbance.  Respiratory: Negative for cough.   Cardiovascular: Negative for chest pain, palpitations and leg swelling.  Gastrointestinal: Negative for abdominal distention, blood in stool, diarrhea and nausea.  Genitourinary: Negative for frequency and hematuria.  Musculoskeletal: Negative for back pain, gait problem, joint swelling and neck pain.  Skin: Negative for rash.  Neurological: Negative for dizziness, tremors, speech difficulty and weakness.  Psychiatric/Behavioral: Negative for agitation, dysphoric mood and sleep disturbance. The patient is not nervous/anxious.     Objective:  BP 122/76 (BP Location: Left Arm, Patient  Position: Sitting, Cuff Size: Large)   Pulse (!) 53   Temp 98.1 F (36.7 C) (Oral)   Ht 5\' 9"  (1.753 m)   Wt 202 lb (91.6 kg)   SpO2 98%   BMI 29.83 kg/m   BP Readings from Last 3 Encounters:  09/08/16 122/76  03/10/16 120/84  09/08/15 130/90    Wt Readings from Last 3 Encounters:  09/08/16 202 lb (91.6 kg)  03/10/16 202 lb (91.6 kg)  09/08/15 195 lb (88.5 kg)    Physical Exam  Constitutional: He is oriented to person, place, and time. He appears well-developed. No distress.  NAD  HENT:  Mouth/Throat: Oropharynx is clear and moist.  Eyes: Pupils are equal, round, and reactive to light. Conjunctivae are normal.  Neck: Normal range of motion. No JVD present. No thyromegaly present.  Cardiovascular: Normal rate, regular rhythm, normal heart sounds and intact distal pulses.  Exam reveals no gallop and no friction rub.   No murmur heard. Pulmonary/Chest: Effort normal and breath sounds normal. No respiratory distress. He has no wheezes. He has no rales. He exhibits no tenderness.  Abdominal: Soft. Bowel sounds are normal. He exhibits no distension and no mass. There is no tenderness. There is no rebound and no guarding.  Genitourinary: Rectum normal and prostate normal. Rectal exam shows guaiac negative stool.  Musculoskeletal: Normal range of motion. He exhibits no edema or tenderness.  Lymphadenopathy:    He has no cervical adenopathy.  Neurological: He is alert and oriented to person, place, and time. He has normal reflexes. No  cranial nerve deficit. He exhibits normal muscle tone. He displays a negative Romberg sign. Coordination and gait normal.  Skin: Skin is warm and dry. No rash noted.  Psychiatric: He has a normal mood and affect. His behavior is normal. Judgment and thought content normal.   Healing rash on R chest  Lab Results  Component Value Date   WBC 5.1 09/07/2015   HGB 13.4 09/07/2015   HCT 38.6 (L) 09/07/2015   PLT 195.0 09/07/2015   GLUCOSE 87 03/10/2016    CHOL 165 09/07/2015   TRIG 143.0 09/07/2015   HDL 33.90 (L) 09/07/2015   LDLCALC 102 (H) 09/07/2015   ALT 16 09/07/2015   AST 18 09/07/2015   NA 139 03/10/2016   K 4.1 03/10/2016   CL 103 03/10/2016   CREATININE 1.04 03/10/2016   BUN 14 03/10/2016   CO2 27 03/10/2016   TSH 2.93 03/10/2016   PSA 0.37 09/07/2015    Ct Renal Stone Study  Result Date: 08/04/2014 CLINICAL DATA:  Flank pain. Symptoms have progressively become worse. Patient has history of kidney stones. EXAM: CT ABDOMEN AND PELVIS WITHOUT CONTRAST TECHNIQUE: Multidetector CT imaging of the abdomen and pelvis was performed following the standard protocol without IV contrast. COMPARISON:  None. FINDINGS: Marked LEFT hydronephrosis and hydroureter. 3 mm LEFT UVJ stone. Small BILATERAL intrarenal calculi. No right-sided obstruction. Within limits for evaluation in the noncontrast state, no intra-abdominal visceral abnormality. Unremarkable bowel. No free fluid or free air. No skeletal abnormalities. Mild vascular calcification. Visualized lung bases clear. IMPRESSION: BILATERAL nephrolithiasis. LEFT hydronephrosis and hydroureter secondary to a 3 mm stone LEFT UVJ. Electronically Signed   By: Rolla Flatten M.D.   On: 08/04/2014 00:08    Assessment & Plan:   There are no diagnoses linked to this encounter. I have discontinued Mr. Herrle's ibuprofen. I am also having him maintain his aspirin, cholecalciferol, cyanocobalamin, saw palmetto, metoprolol succinate, and levothyroxine.  No orders of the defined types were placed in this encounter.    Follow-up: No Follow-up on file.  Walker Kehr, MD

## 2016-10-27 ENCOUNTER — Other Ambulatory Visit: Payer: Self-pay | Admitting: Internal Medicine

## 2016-11-09 ENCOUNTER — Emergency Department (HOSPITAL_COMMUNITY): Payer: Medicare Other

## 2016-11-09 ENCOUNTER — Emergency Department (HOSPITAL_COMMUNITY)
Admission: EM | Admit: 2016-11-09 | Discharge: 2016-11-09 | Disposition: A | Discharge status: Home / Self Care | Payer: Medicare Other | Attending: Emergency Medicine | Admitting: Emergency Medicine

## 2016-11-09 ENCOUNTER — Encounter (HOSPITAL_COMMUNITY): Payer: Self-pay | Admitting: Emergency Medicine

## 2016-11-09 DIAGNOSIS — Z87891 Personal history of nicotine dependence: Secondary | ICD-10-CM | POA: Insufficient documentation

## 2016-11-09 DIAGNOSIS — Z79899 Other long term (current) drug therapy: Secondary | ICD-10-CM | POA: Insufficient documentation

## 2016-11-09 DIAGNOSIS — Z7982 Long term (current) use of aspirin: Secondary | ICD-10-CM | POA: Diagnosis not present

## 2016-11-09 DIAGNOSIS — I10 Essential (primary) hypertension: Secondary | ICD-10-CM | POA: Insufficient documentation

## 2016-11-09 DIAGNOSIS — N200 Calculus of kidney: Secondary | ICD-10-CM | POA: Diagnosis not present

## 2016-11-09 DIAGNOSIS — N289 Disorder of kidney and ureter, unspecified: Secondary | ICD-10-CM | POA: Insufficient documentation

## 2016-11-09 DIAGNOSIS — R109 Unspecified abdominal pain: Secondary | ICD-10-CM | POA: Diagnosis present

## 2016-11-09 LAB — CBC WITH DIFFERENTIAL/PLATELET
Basophils Absolute: 0 10*3/uL (ref 0.0–0.1)
Basophils Relative: 0 %
EOS ABS: 0.1 10*3/uL (ref 0.0–0.7)
Eosinophils Relative: 1 %
HEMATOCRIT: 36.8 % — AB (ref 39.0–52.0)
HEMOGLOBIN: 13 g/dL (ref 13.0–17.0)
LYMPHS ABS: 1 10*3/uL (ref 0.7–4.0)
LYMPHS PCT: 11 %
MCH: 29.4 pg (ref 26.0–34.0)
MCHC: 35.3 g/dL (ref 30.0–36.0)
MCV: 83.3 fL (ref 78.0–100.0)
MONOS PCT: 6 %
Monocytes Absolute: 0.6 10*3/uL (ref 0.1–1.0)
NEUTROS PCT: 82 %
Neutro Abs: 7.9 10*3/uL — ABNORMAL HIGH (ref 1.7–7.7)
Platelets: 159 10*3/uL (ref 150–400)
RBC: 4.42 MIL/uL (ref 4.22–5.81)
RDW: 12.7 % (ref 11.5–15.5)
WBC: 9.6 10*3/uL (ref 4.0–10.5)

## 2016-11-09 LAB — URINALYSIS, ROUTINE W REFLEX MICROSCOPIC
Bacteria, UA: NONE SEEN
Bilirubin Urine: NEGATIVE
GLUCOSE, UA: NEGATIVE mg/dL
Ketones, ur: NEGATIVE mg/dL
Leukocytes, UA: NEGATIVE
Nitrite: NEGATIVE
PROTEIN: NEGATIVE mg/dL
Specific Gravity, Urine: 1.021 (ref 1.005–1.030)
Squamous Epithelial / LPF: NONE SEEN
pH: 6 (ref 5.0–8.0)

## 2016-11-09 LAB — I-STAT CHEM 8, ED
BUN: 22 mg/dL — AB (ref 6–20)
CHLORIDE: 106 mmol/L (ref 101–111)
Calcium, Ion: 1.12 mmol/L — ABNORMAL LOW (ref 1.15–1.40)
Creatinine, Ser: 1.6 mg/dL — ABNORMAL HIGH (ref 0.61–1.24)
Glucose, Bld: 127 mg/dL — ABNORMAL HIGH (ref 65–99)
HEMATOCRIT: 39 % (ref 39.0–52.0)
Hemoglobin: 13.3 g/dL (ref 13.0–17.0)
POTASSIUM: 4.1 mmol/L (ref 3.5–5.1)
SODIUM: 140 mmol/L (ref 135–145)
TCO2: 27 mmol/L (ref 22–32)

## 2016-11-09 MED ORDER — ONDANSETRON HCL 4 MG/2ML IJ SOLN
4.0000 mg | Freq: Once | INTRAMUSCULAR | Status: AC
  Administered 2016-11-09: 4 mg via INTRAVENOUS
  Filled 2016-11-09: qty 2

## 2016-11-09 MED ORDER — OXYCODONE-ACETAMINOPHEN 5-325 MG PO TABS: 1.0000 | ORAL_TABLET | ORAL | 0 refills | Status: DC | PRN

## 2016-11-09 MED ORDER — TAMSULOSIN HCL 0.4 MG PO CAPS: 0.4000 mg | ORAL_CAPSULE | Freq: Every day | ORAL | 0 refills | Status: DC

## 2016-11-09 MED ORDER — HYDROMORPHONE HCL 1 MG/ML IJ SOLN
1.0000 mg | Freq: Once | INTRAMUSCULAR | Status: AC
  Administered 2016-11-09: 1 mg via INTRAVENOUS
  Filled 2016-11-09: qty 1

## 2016-11-09 MED ORDER — HYDROMORPHONE HCL 1 MG/ML IJ SOLN
0.5000 mg | Freq: Once | INTRAMUSCULAR | Status: AC
  Administered 2016-11-09: 0.5 mg via INTRAVENOUS
  Filled 2016-11-09: qty 1

## 2016-11-09 MED ORDER — ONDANSETRON 4 MG PO TBDP: 4.0000 mg | ORAL_TABLET | Freq: Three times a day (TID) | ORAL | 0 refills | Status: DC | PRN

## 2016-11-09 MED ORDER — OXYCODONE-ACETAMINOPHEN 5-325 MG PO TABS
2.0000 | ORAL_TABLET | Freq: Once | ORAL | Status: AC
  Administered 2016-11-09: 2 via ORAL
  Filled 2016-11-09: qty 2

## 2016-11-09 MED ORDER — PROMETHAZINE HCL 25 MG PO TABS
25.0000 mg | ORAL_TABLET | Freq: Once | ORAL | Status: AC
  Administered 2016-11-09: 25 mg via ORAL
  Filled 2016-11-09: qty 1

## 2016-11-09 MED ORDER — SODIUM CHLORIDE 0.9 % IV BOLUS (SEPSIS)
1000.0000 mL | Freq: Once | INTRAVENOUS | Status: AC
  Administered 2016-11-09: 1000 mL via INTRAVENOUS

## 2016-11-09 NOTE — ED Notes (Signed)
Patient was alert, oriented and stable upon discharge. RN went over AVS and patient had no further questions.  Patient was instructed not to drive or operate heavy machinery on narcotic pain medication.   

## 2016-11-09 NOTE — ED Notes (Signed)
Pt in Ct  

## 2016-11-09 NOTE — Discharge Instructions (Signed)
Follow up with urology for recheck of obstructing kidney stone and elevated kidney function tests. Return here with any high fever, uncontrolled pain or vomiting. Take medications as prescribed.

## 2016-11-09 NOTE — ED Provider Notes (Signed)
Medical screening examination/treatment/procedure(s) were conducted as a shared visit with non-physician practitioner(s) and myself.  I personally evaluated the patient during the encounter.   EKG Interpretation None      Pt is a 66 y.o. male with history of previous kidney stones who is never recurrent intervention who presents emergency department with one day of left-sided flank pain, nausea and vomiting. No fevers, dysuria but states he feels that he is having a hard time urinating. Pain feels similar to his previous kidney stones.  Patient's labs are unremarkable. CT scan shows obstructing 4 mm stone in the distal left ureter just proximal to the UVJ with moderate hydronephrosis.  Urine shows Blood but no sign of infection. Creatinine mildly elevated at 1.6. Will have him avoid NSAIDs and he has received IV fluids in the emergency department.   Ward, Delice Bison, DO 11/09/16 [phone]

## 2016-11-09 NOTE — ED Triage Notes (Signed)
Patient here with complaints of left side flank pain radiating into abdomen that started last night. Hx of kidney stones.

## 2016-11-09 NOTE — ED Provider Notes (Signed)
[facility] DEPT Provider Note   CSN: [id number] Arrival date & time: 11/09/16  0256     History   Chief Complaint Chief Complaint  Patient presents with  . Flank Pain  . Nausea    HPI Shane Short is a 66 y.o. male.  Patient with a history of HTN, HLD, kidney stones, OSA presents with left flank pain that started last night. No fever. He reports he feels like he needs to urinate but can't. He has not noticed any hematuria recently. He states he has had kidney stones in the past and current symptoms feel similar. No CP, SOB, constipation.    The history is provided by the patient. No language interpreter was used.    Past Medical History:  Diagnosis Date  . Dyslipidemia (high LDL; low HDL)    low hdl  . Hypertension   . OSA on CPAP   . Thyroid disease    hypo  . Vitamin B12 deficiency 2011    Patient Active Problem List   Diagnosis Date Noted  . Shingles 09/08/2016  . Otitis media 09/08/2015  . Bradycardia 09/09/2014  . Kidney stones 09/09/2014  . Acute sinusitis 09/02/2013  . Left arm pain 09/02/2013  . Dyslipidemia 09/02/2013  . CAP (community acquired pneumonia) 03/20/2013  . Well adult exam 08/27/2010  . Actinic keratosis 02/26/2010  . B12 deficiency 11/13/2009  . SHOULDER PAIN 11/13/2009  . PARESTHESIA 11/13/2009  . Hypothyroidism 03/15/2007  . OBSTRUCTIVE SLEEP APNEA 12/16/2006  . Essential hypertension 12/16/2006    Past Surgical History:  Procedure Laterality Date  . HERNIA REPAIR    . lower venous doppler  05-02-2006       Home Medications    Prior to Admission medications   Medication Sig Start Date End Date Taking? Authorizing Provider  aspirin 81 MG EC tablet Take 81 mg by mouth daily after breakfast.      [provider]  Cholecalciferol (VITAMIN D3) 1000 UNITS tablet Take 1,000 Units by mouth daily.      [provider]  cyanocobalamin 1000 MCG tablet Take 1,000 mcg by mouth every other day.     [provider]  levothyroxine (SYNTHROID, LEVOTHROID) 125 MCG tablet Take 1 tablet (125 mcg total) by mouth daily before breakfast. 06/03/16   Plotnikov, Evie Lacks, MD  metoprolol succinate (TOPROL-XL) 25 MG 24 hr tablet Take 0.5 tablets (12.5 mg total) by mouth daily. 09/09/14   Plotnikov, Evie Lacks, MD  metoprolol succinate (TOPROL-XL) 25 MG 24 hr tablet TAKE ONE TABLET BY MOUTH ONCE DAILY 10/27/16   Plotnikov, Evie Lacks, MD  saw palmetto 160 MG capsule 1 po qd 09/08/14   Plotnikov, Evie Lacks, MD    Family History Family History  Problem Relation Age of Onset  . Mental illness Mother        dementia  . COPD Father   . Lung cancer Father   . Stroke Father        brain aneurism  . Hypertension Other     Social History Social History  Substance Use Topics  . Smoking status: Former Research scientist (life sciences)  . Smokeless tobacco: Never Used     Comment: quit 30 years ago  . Alcohol use No     Allergies   Patient has no known allergies.   Review of Systems Review of Systems  Constitutional: Negative for chills and fever.  Respiratory: Negative.   Cardiovascular: Negative.   Gastrointestinal: Positive for abdominal pain and nausea. Negative for vomiting.  Genitourinary: Positive for flank pain (Radiating to LLQ and groin.).  Skin: Negative.   Neurological: Negative.      Physical Exam Updated Vital Signs BP (!) 173/96 (BP Location: Right Arm)   Pulse (!) 55   Temp 98.5 F (36.9 C) (Oral)   Resp (!) 23   SpO2 100%   Physical Exam  Constitutional: He is oriented to person, place, and time. He appears well-developed and well-nourished.  Neck: Normal range of motion.  Pulmonary/Chest: Effort normal.  Abdominal: There is tenderness (LLQ).  Genitourinary:  Genitourinary Comments: No scrotal swelling, or testicular tenderness.   Musculoskeletal: Normal range of motion.  Neurological: He is alert and oriented to person, place, and time.  Skin: Skin is warm and dry.  Psychiatric: He has a  normal mood and affect.     ED Treatments / Results  Labs (all labs ordered are listed, but only abnormal results are displayed) Labs Reviewed  URINALYSIS, ROUTINE W REFLEX MICROSCOPIC  CBC WITH DIFFERENTIAL/PLATELET  I-STAT CHEM 8, ED   Results for orders placed or performed during the hospital encounter of 11/09/16  CBC with Differential/Platelet  Result Value Ref Range   WBC 9.6 4.0 - 10.5 K/uL   RBC 4.42 4.22 - 5.81 MIL/uL   Hemoglobin 13.0 13.0 - 17.0 g/dL   HCT 36.8 (L) 39.0 - 52.0 %   MCV 83.3 78.0 - 100.0 fL   MCH 29.4 26.0 - 34.0 pg   MCHC 35.3 30.0 - 36.0 g/dL   RDW 12.7 11.5 - 15.5 %   Platelets 159 150 - 400 K/uL   Neutrophils Relative % 82 %   Neutro Abs 7.9 (H) 1.7 - 7.7 K/uL   Lymphocytes Relative 11 %   Lymphs Abs 1.0 0.7 - 4.0 K/uL   Monocytes Relative 6 %   Monocytes Absolute 0.6 0.1 - 1.0 K/uL   Eosinophils Relative 1 %   Eosinophils Absolute 0.1 0.0 - 0.7 K/uL   Basophils Relative 0 %   Basophils Absolute 0.0 0.0 - 0.1 K/uL  I-stat Chem 8, ED  Result Value Ref Range   Sodium 140 135 - 145 mmol/L   Potassium 4.1 3.5 - 5.1 mmol/L   Chloride 106 101 - 111 mmol/L   BUN 22 (H) 6 - 20 mg/dL   Creatinine, Ser 1.60 (H) 0.61 - 1.24 mg/dL   Glucose, Bld 127 (H) 65 - 99 mg/dL   Calcium, Ion 1.12 (L) 1.15 - 1.40 mmol/L   TCO2 27 22 - 32 mmol/L   Hemoglobin 13.3 13.0 - 17.0 g/dL   HCT 39.0 39.0 - 52.0 %    EKG  EKG Interpretation None       Radiology No results found.  Procedures Procedures (including critical care time)  Medications Ordered in ED Medications  HYDROmorphone (DILAUDID) injection 1 mg (not administered)  ondansetron (ZOFRAN) injection 4 mg (not administered)     Initial Impression / Assessment and Plan / ED Course  I have reviewed the triage vital signs and the nursing notes.  Pertinent labs & imaging results that were available during my care of the patient were reviewed by me and considered in my medical decision making  (see chart for details).     Patient with a history of kidney stones here with sudden left flank pain and difficulty urinating since last night. Nausea without vomiting. No fever.   4:00 - IV started, with pain medications ordered. Labs pending.   4:30 - Pain and nausea medications given and he  reports he is much more comfortable. Still unable to urinate.   5:30 - CT scan shows 4 mm obstructing stone in the left distal ureter. Still unable to urinate.   6:45 - I&O cath done, 240 cc drained from bladder. Bladder scan pending. Pain addressed with additional 0.5 mg Dilaudid. Toradol withheld due to elevated renal functions. 1 liter bolus running.   7:45 - urine without infection. Pain is controlled. He will need to follow up with urology given finding of 4 mm obstructing stone and elevated renal functions. Discussed return precautions with the patient.    Final Clinical Impressions(s) / ED Diagnoses   Final diagnoses:  None   1. Kidney stone, 4 mm, obstructing 2. Renal insufficiency  New Prescriptions New Prescriptions   No medications on file     Charlann Lange, Hershal Coria 11/09/16 [id number]

## 2016-12-02 ENCOUNTER — Other Ambulatory Visit: Payer: Self-pay | Admitting: Internal Medicine

## 2016-12-08 DIAGNOSIS — Z85828 Personal history of other malignant neoplasm of skin: Secondary | ICD-10-CM | POA: Diagnosis not present

## 2016-12-08 DIAGNOSIS — L570 Actinic keratosis: Secondary | ICD-10-CM | POA: Diagnosis not present

## 2016-12-08 DIAGNOSIS — D225 Melanocytic nevi of trunk: Secondary | ICD-10-CM | POA: Diagnosis not present

## 2016-12-08 DIAGNOSIS — L821 Other seborrheic keratosis: Secondary | ICD-10-CM | POA: Diagnosis not present

## 2016-12-17 ENCOUNTER — Ambulatory Visit (INDEPENDENT_AMBULATORY_CARE_PROVIDER_SITE_OTHER): Payer: Medicare HMO

## 2016-12-17 DIAGNOSIS — Z23 Encounter for immunization: Secondary | ICD-10-CM | POA: Diagnosis not present

## 2017-03-14 ENCOUNTER — Other Ambulatory Visit (INDEPENDENT_AMBULATORY_CARE_PROVIDER_SITE_OTHER): Payer: Medicare HMO

## 2017-03-14 ENCOUNTER — Ambulatory Visit (INDEPENDENT_AMBULATORY_CARE_PROVIDER_SITE_OTHER): Payer: Medicare HMO | Admitting: Internal Medicine

## 2017-03-14 ENCOUNTER — Encounter: Payer: Self-pay | Admitting: Internal Medicine

## 2017-03-14 DIAGNOSIS — E538 Deficiency of other specified B group vitamins: Secondary | ICD-10-CM

## 2017-03-14 DIAGNOSIS — R001 Bradycardia, unspecified: Secondary | ICD-10-CM

## 2017-03-14 DIAGNOSIS — I10 Essential (primary) hypertension: Secondary | ICD-10-CM | POA: Diagnosis not present

## 2017-03-14 DIAGNOSIS — E034 Atrophy of thyroid (acquired): Secondary | ICD-10-CM

## 2017-03-14 LAB — LIPID PANEL
CHOLESTEROL: 161 mg/dL (ref 0–200)
HDL: 34.9 mg/dL — ABNORMAL LOW (ref >39.00)
LDL Cholesterol: 93 mg/dL (ref 0–99)
NonHDL: 125.7
TRIGLYCERIDES: 165 mg/dL — AB (ref 0.0–149.0)
Total CHOL/HDL Ratio: 5
VLDL: 33 mg/dL (ref 0.0–40.0)

## 2017-03-14 LAB — BASIC METABOLIC PANEL
BUN: 10 mg/dL (ref 6–23)
CHLORIDE: 103 meq/L (ref 96–112)
CO2: 31 meq/L (ref 19–32)
Calcium: 9.4 mg/dL (ref 8.4–10.5)
Creatinine, Ser: 1.08 mg/dL (ref 0.40–1.50)
GFR: 72.81 mL/min (ref >60.00)
GLUCOSE: 103 mg/dL — AB (ref 70–99)
POTASSIUM: 4.8 meq/L (ref 3.5–5.1)
SODIUM: 140 meq/L (ref 135–145)

## 2017-03-14 LAB — TSH: TSH: 3.19 u[IU]/mL (ref 0.35–4.50)

## 2017-03-14 NOTE — Assessment & Plan Note (Signed)
Due to Metoprolol, asymptomatic HR 54s

## 2017-03-14 NOTE — Progress Notes (Signed)
Subjective:  Patient ID: Shane Short, male    DOB: 08/30/1950  Age: 67 y.o. MRN: [id number]  CC: No chief complaint on file.   HPI Shane Short presents for HTN, hypothyroidism. C/o low HR: HR 54  Outpatient Medications Prior to Visit  Medication Sig Dispense Refill  . aspirin 81 MG EC tablet Take 81 mg by mouth daily after breakfast.      . Cholecalciferol (VITAMIN D3) 1000 UNITS tablet Take 1,000 Units by mouth daily.      Marland Kitchen levothyroxine (SYNTHROID, LEVOTHROID) 125 MCG tablet Take 1 tablet (125 mcg total) by mouth daily before breakfast. 90 tablet 1  . metoprolol succinate (TOPROL-XL) 25 MG 24 hr tablet Take 0.5 tablets (12.5 mg total) by mouth daily. 30 tablet 11  . ondansetron (ZOFRAN ODT) 4 MG disintegrating tablet Take 1 tablet (4 mg total) by mouth every 8 (eight) hours as needed for nausea or vomiting. 20 tablet 0  . oxyCODONE-acetaminophen (PERCOCET/ROXICET) 5-325 MG tablet Take 1-2 tablets by mouth every 4 (four) hours as needed for severe pain. 15 tablet 0  . saw palmetto 160 MG capsule 1 po qd 100 capsule 3  . tamsulosin (FLOMAX) 0.4 MG CAPS capsule Take 1 capsule (0.4 mg total) by mouth daily after breakfast. 10 capsule 0  . levothyroxine (SYNTHROID, LEVOTHROID) 100 MCG tablet Take 100 mcg by mouth daily before breakfast.    . metoprolol succinate (TOPROL-XL) 25 MG 24 hr tablet TAKE ONE TABLET BY MOUTH ONCE DAILY (Patient not taking: Reported on 11/09/2016) 31 tablet 11   No facility-administered medications prior to visit.     ROS Review of Systems  Constitutional: Negative for appetite change, fatigue and unexpected weight change.  HENT: Negative for congestion, nosebleeds, sneezing, sore throat and trouble swallowing.   Eyes: Negative for itching and visual disturbance.  Respiratory: Negative for cough.   Cardiovascular: Negative for chest pain, palpitations and leg swelling.  Gastrointestinal: Negative for abdominal distention, blood in stool, diarrhea and  nausea.  Genitourinary: Negative for frequency and hematuria.  Musculoskeletal: Negative for back pain, gait problem, joint swelling and neck pain.  Skin: Negative for rash.  Neurological: Negative for dizziness, tremors, speech difficulty and weakness.  Psychiatric/Behavioral: Negative for agitation, dysphoric mood and sleep disturbance. The patient is not nervous/anxious.     Objective:  BP 120/78 (BP Location: Left Arm, Patient Position: Sitting, Cuff Size: Large)   Pulse (!) 54   Temp 98.6 F (37 C) (Oral)   Ht 5\' 9"  (1.753 m)   Wt 204 lb (92.5 kg)   SpO2 98%   BMI 30.13 kg/m   BP Readings from Last 3 Encounters:  03/14/17 120/78  11/09/16 (!) 150/98  09/08/16 122/76    Wt Readings from Last 3 Encounters:  03/14/17 204 lb (92.5 kg)  09/08/16 202 lb (91.6 kg)  03/10/16 202 lb (91.6 kg)    Physical Exam  Constitutional: He is oriented to person, place, and time. He appears well-developed. No distress.  NAD  HENT:  Mouth/Throat: Oropharynx is clear and moist.  Eyes: Conjunctivae are normal. Pupils are equal, round, and reactive to light.  Neck: Normal range of motion. No JVD present. No thyromegaly present.  Cardiovascular: Normal rate, regular rhythm, normal heart sounds and intact distal pulses. Exam reveals no gallop and no friction rub.  No murmur heard. Pulmonary/Chest: Effort normal and breath sounds normal. No respiratory distress. He has no wheezes. He has no rales. He exhibits no tenderness.  Abdominal: Soft. Bowel  sounds are normal. He exhibits no distension and no mass. There is no tenderness. There is no rebound and no guarding.  Musculoskeletal: Normal range of motion. He exhibits no edema or tenderness.  Lymphadenopathy:    He has no cervical adenopathy.  Neurological: He is alert and oriented to person, place, and time. He has normal reflexes. No cranial nerve deficit. He exhibits normal muscle tone. He displays a negative Romberg sign. Coordination and  gait normal.  Skin: Skin is warm and dry. No rash noted.  Psychiatric: He has a normal mood and affect. His behavior is normal. Judgment and thought content normal.    Lab Results  Component Value Date   WBC 9.6 11/09/2016   HGB 13.0 11/09/2016   HCT 36.8 (L) 11/09/2016   PLT 159 11/09/2016   GLUCOSE 127 (H) 11/09/2016   CHOL 172 09/08/2016   TRIG 158.0 (H) 09/08/2016   HDL 34.90 (L) 09/08/2016   LDLCALC 106 (H) 09/08/2016   ALT 20 09/08/2016   AST 21 09/08/2016   NA 140 11/09/2016   K 4.1 11/09/2016   CL 106 11/09/2016   CREATININE 1.60 (H) 11/09/2016   BUN 22 (H) 11/09/2016   CO2 28 09/08/2016   TSH 2.70 09/08/2016   PSA 0.50 09/08/2016    Ct Renal Stone Study  Result Date: 11/09/2016 CLINICAL DATA:  Left flank pain. EXAM: CT ABDOMEN AND PELVIS WITHOUT CONTRAST TECHNIQUE: Multidetector CT imaging of the abdomen and pelvis was performed following the standard protocol without IV contrast. COMPARISON:  CT 08/03/2014 FINDINGS: Lower chest: The lung bases are clear. Hepatobiliary: Mild hepatic steatosis, no evidence of focal lesion. Gallbladder physiologically distended, no calcified stone. No biliary dilatation. Pancreas: No ductal dilatation or inflammation. Spleen: Normal in size without focal abnormality. Adrenals/Urinary Tract: Normal adrenal glands. Obstructing 4 x 4 mm stone in the distal left ureter just proximal to the ureterovesicular junction with moderate left hydroureteronephrosis. Moderate left perinephric edema. Multiple, at least 7, nonobstructing stones in the left kidney. No right hydronephrosis or hydroureter. Multiple, at least 8, nonobstructing stones in the right kidney. Urinary bladder is physiologically distended without stone or wall thickening. Stomach/Bowel: Diverticulosis throughout the entire colon without acute diverticulitis. Appendix not confidently visualized. No small bowel dilatation or inflammation. Stomach is nondistended. Vascular/Lymphatic: Mild  aortic and branch atherosclerosis without aneurysm. No abdominal or pelvic adenopathy. Reproductive: Prostate is unremarkable. Other: No free air, free fluid, or intra-abdominal fluid collection. Tiny fat containing umbilical hernia. Musculoskeletal: There are no acute or suspicious osseous abnormalities. IMPRESSION: 1. Obstructing 4 mm stone in the distal left ureter just proximal to the ureterovesicular junction with moderate hydronephrosis and perinephric edema. 2. Multiple additional nonobstructing bilateral renal calculi. 3. Multifocal colonic diverticulosis throughout the entire colon. No acute diverticulitis. 4. Hepatic steatosis. 5.  Aortic Atherosclerosis (ICD10-I70.0). Electronically Signed   By: Jeb Levering M.D.   On: 11/09/2016 05:42    Assessment & Plan:   There are no diagnoses linked to this encounter. I am having Nelda Severe. Mcpheeters maintain his aspirin, cholecalciferol, saw palmetto, metoprolol succinate, oxyCODONE-acetaminophen, ondansetron, tamsulosin, and levothyroxine.  No orders of the defined types were placed in this encounter.    Follow-up: No Follow-up on file.  Walker Kehr, MD

## 2017-03-14 NOTE — Assessment & Plan Note (Signed)
On B12 

## 2017-03-14 NOTE — Assessment & Plan Note (Signed)
On Levothroid 

## 2017-03-14 NOTE — Assessment & Plan Note (Signed)
Chronic. On Toprol

## 2017-06-19 ENCOUNTER — Other Ambulatory Visit: Payer: Self-pay | Admitting: Internal Medicine

## 2017-09-11 ENCOUNTER — Ambulatory Visit (INDEPENDENT_AMBULATORY_CARE_PROVIDER_SITE_OTHER): Payer: Medicare HMO | Admitting: Internal Medicine

## 2017-09-11 ENCOUNTER — Other Ambulatory Visit (INDEPENDENT_AMBULATORY_CARE_PROVIDER_SITE_OTHER): Payer: Medicare HMO

## 2017-09-11 ENCOUNTER — Encounter: Payer: Self-pay | Admitting: Internal Medicine

## 2017-09-11 VITALS — BP 126/80 | HR 95 | Temp 98.4°F | Ht 69.0 in | Wt 194.0 lb

## 2017-09-11 DIAGNOSIS — Z0000 Encounter for general adult medical examination without abnormal findings: Secondary | ICD-10-CM

## 2017-09-11 DIAGNOSIS — E538 Deficiency of other specified B group vitamins: Secondary | ICD-10-CM

## 2017-09-11 DIAGNOSIS — E785 Hyperlipidemia, unspecified: Secondary | ICD-10-CM | POA: Diagnosis not present

## 2017-09-11 DIAGNOSIS — R05 Cough: Secondary | ICD-10-CM

## 2017-09-11 DIAGNOSIS — I10 Essential (primary) hypertension: Secondary | ICD-10-CM | POA: Diagnosis not present

## 2017-09-11 DIAGNOSIS — R059 Cough, unspecified: Secondary | ICD-10-CM

## 2017-09-11 LAB — CBC WITH DIFFERENTIAL/PLATELET
Basophils Absolute: 0 10*3/uL (ref 0.0–0.1)
Basophils Relative: 0.6 % (ref 0.0–3.0)
Eosinophils Absolute: 0.5 10*3/uL (ref 0.0–0.7)
Eosinophils Relative: 9.7 % — ABNORMAL HIGH (ref 0.0–5.0)
HCT: 40.4 % (ref 39.0–52.0)
Hemoglobin: 13.7 g/dL (ref 13.0–17.0)
LYMPHS ABS: 1.6 10*3/uL (ref 0.7–4.0)
Lymphocytes Relative: 28.5 % (ref 12.0–46.0)
MCHC: 33.9 g/dL (ref 30.0–36.0)
MCV: 85.4 fl (ref 78.0–100.0)
MONO ABS: 0.6 10*3/uL (ref 0.1–1.0)
Monocytes Relative: 11.2 % (ref 3.0–12.0)
NEUTROS PCT: 50 % (ref 43.0–77.0)
Neutro Abs: 2.8 10*3/uL (ref 1.4–7.7)
Platelets: 194 10*3/uL (ref 150.0–400.0)
RBC: 4.73 Mil/uL (ref 4.22–5.81)
RDW: 13.1 % (ref 11.5–15.5)
WBC: 5.6 10*3/uL (ref 4.0–10.5)

## 2017-09-11 LAB — URINALYSIS
BILIRUBIN URINE: NEGATIVE
HGB URINE DIPSTICK: NEGATIVE
Ketones, ur: NEGATIVE
Leukocytes, UA: NEGATIVE
Nitrite: NEGATIVE
PH: 6 (ref 5.0–8.0)
Specific Gravity, Urine: 1.015 (ref 1.000–1.030)
Total Protein, Urine: NEGATIVE
URINE GLUCOSE: NEGATIVE
Urobilinogen, UA: 0.2 (ref 0.0–1.0)

## 2017-09-11 LAB — HEPATIC FUNCTION PANEL
ALK PHOS: 63 U/L (ref 39–117)
ALT: 23 U/L (ref 0–53)
AST: 22 U/L (ref 0–37)
Albumin: 4.2 g/dL (ref 3.5–5.2)
BILIRUBIN DIRECT: 0.1 mg/dL (ref 0.0–0.3)
TOTAL PROTEIN: 7.2 g/dL (ref 6.0–8.3)
Total Bilirubin: 0.5 mg/dL (ref 0.2–1.2)

## 2017-09-11 LAB — LIPID PANEL
Cholesterol: 136 mg/dL (ref 0–200)
HDL: 27.4 mg/dL — ABNORMAL LOW (ref >39.00)
LDL CALC: 74 mg/dL (ref 0–99)
NONHDL: 108.17
Total CHOL/HDL Ratio: 5
Triglycerides: 170 mg/dL — ABNORMAL HIGH (ref 0.0–149.0)
VLDL: 34 mg/dL (ref 0.0–40.0)

## 2017-09-11 LAB — BASIC METABOLIC PANEL
BUN: 11 mg/dL (ref 6–23)
CHLORIDE: 103 meq/L (ref 96–112)
CO2: 29 meq/L (ref 19–32)
CREATININE: 1.12 mg/dL (ref 0.40–1.50)
Calcium: 9.3 mg/dL (ref 8.4–10.5)
GFR: 69.71 mL/min (ref >60.00)
Glucose, Bld: 99 mg/dL (ref 70–99)
Potassium: 4.9 mEq/L (ref 3.5–5.1)
Sodium: 140 mEq/L (ref 135–145)

## 2017-09-11 LAB — VITAMIN B12

## 2017-09-11 LAB — TSH: TSH: 5.81 u[IU]/mL — ABNORMAL HIGH (ref 0.35–4.50)

## 2017-09-11 LAB — PSA: PSA: 0.5 ng/mL (ref 0.10–4.00)

## 2017-09-11 NOTE — Progress Notes (Signed)
Subjective:  Patient ID: Shane Short, male    DOB: 1950-04-03  Age: 67 y.o. MRN: [id number]  CC: No chief complaint on file.   HPI Shane Short presents for a well exam  Outpatient Medications Prior to Visit  Medication Sig Dispense Refill  . aspirin 81 MG EC tablet Take 81 mg by mouth daily after breakfast.      . Cholecalciferol (VITAMIN D3) 1000 UNITS tablet Take 1,000 Units by mouth daily.      Marland Kitchen levothyroxine (SYNTHROID, LEVOTHROID) 125 MCG tablet TAKE 1 TABLET BY MOUTH ONCE DAILY BEFORE BREAKFAST 90 tablet 3  . metoprolol succinate (TOPROL-XL) 25 MG 24 hr tablet Take 0.5 tablets (12.5 mg total) by mouth daily. 30 tablet 11  . saw palmetto 160 MG capsule 1 po qd 100 capsule 3  . ondansetron (ZOFRAN ODT) 4 MG disintegrating tablet Take 1 tablet (4 mg total) by mouth every 8 (eight) hours as needed for nausea or vomiting. (Patient not taking: Reported on 09/11/2017) 20 tablet 0  . oxyCODONE-acetaminophen (PERCOCET/ROXICET) 5-325 MG tablet Take 1-2 tablets by mouth every 4 (four) hours as needed for severe pain. (Patient not taking: Reported on 09/11/2017) 15 tablet 0  . tamsulosin (FLOMAX) 0.4 MG CAPS capsule Take 1 capsule (0.4 mg total) by mouth daily after breakfast. (Patient not taking: Reported on 09/11/2017) 10 capsule 0   No facility-administered medications prior to visit.     ROS: Review of Systems  Constitutional: Negative for appetite change, fatigue and unexpected weight change.  HENT: Negative for congestion, nosebleeds, sneezing, sore throat and trouble swallowing.   Eyes: Negative for itching and visual disturbance.  Respiratory: Negative for cough.   Cardiovascular: Negative for chest pain, palpitations and leg swelling.  Gastrointestinal: Negative for abdominal distention, blood in stool, diarrhea and nausea.  Genitourinary: Negative for frequency and hematuria.  Musculoskeletal: Positive for arthralgias. Negative for back pain, gait problem, joint swelling  and neck pain.  Skin: Negative for rash.  Neurological: Negative for dizziness, tremors, speech difficulty and weakness.  Psychiatric/Behavioral: Negative for agitation, dysphoric mood, sleep disturbance and suicidal ideas. The patient is not nervous/anxious.     Objective:  BP 126/80 (BP Location: Left Arm, Patient Position: Sitting, Cuff Size: Normal)   Pulse 95   Temp 98.4 F (36.9 C) (Oral)   Ht 5\' 9"  (1.753 m)   Wt 194 lb (88 kg)   SpO2 96%   BMI 28.65 kg/m   BP Readings from Last 3 Encounters:  09/11/17 126/80  03/14/17 120/78  11/09/16 (!) 150/98    Wt Readings from Last 3 Encounters:  09/11/17 194 lb (88 kg)  03/14/17 204 lb (92.5 kg)  09/08/16 202 lb (91.6 kg)    Physical Exam  Constitutional: He is oriented to person, place, and time. He appears well-developed. No distress.  NAD  HENT:  Mouth/Throat: Oropharynx is clear and moist.  Eyes: Pupils are equal, round, and reactive to light. Conjunctivae are normal.  Neck: Normal range of motion. No JVD present. No thyromegaly present.  Cardiovascular: Normal rate, regular rhythm, normal heart sounds and intact distal pulses. Exam reveals no gallop and no friction rub.  No murmur heard. Pulmonary/Chest: Effort normal and breath sounds normal. No respiratory distress. He has no wheezes. He has no rales. He exhibits no tenderness.  Abdominal: Soft. Bowel sounds are normal. He exhibits no distension and no mass. There is no tenderness. There is no rebound and no guarding.  Genitourinary: Rectum normal and prostate normal.  Rectal exam shows guaiac negative stool.  Musculoskeletal: Normal range of motion. He exhibits no edema or tenderness.  Lymphadenopathy:    He has no cervical adenopathy.  Neurological: He is alert and oriented to person, place, and time. He has normal reflexes. No cranial nerve deficit. He exhibits normal muscle tone. He displays a negative Romberg sign. Coordination and gait normal.  Skin: Skin is warm  and dry. No rash noted.  Psychiatric: He has a normal mood and affect. His behavior is normal. Judgment and thought content normal.  prostate 1+  Lab Results  Component Value Date   WBC 9.6 11/09/2016   HGB 13.0 11/09/2016   HCT 36.8 (L) 11/09/2016   PLT 159 11/09/2016   GLUCOSE 103 (H) 03/14/2017   CHOL 161 03/14/2017   TRIG 165.0 (H) 03/14/2017   HDL 34.90 (L) 03/14/2017   LDLCALC 93 03/14/2017   ALT 20 09/08/2016   AST 21 09/08/2016   NA 140 03/14/2017   K 4.8 03/14/2017   CL 103 03/14/2017   CREATININE 1.08 03/14/2017   BUN 10 03/14/2017   CO2 31 03/14/2017   TSH 3.19 03/14/2017   PSA 0.50 09/08/2016    Ct Renal Stone Study  Result Date: 11/09/2016 CLINICAL DATA:  Left flank pain. EXAM: CT ABDOMEN AND PELVIS WITHOUT CONTRAST TECHNIQUE: Multidetector CT imaging of the abdomen and pelvis was performed following the standard protocol without IV contrast. COMPARISON:  CT 08/03/2014 FINDINGS: Lower chest: The lung bases are clear. Hepatobiliary: Mild hepatic steatosis, no evidence of focal lesion. Gallbladder physiologically distended, no calcified stone. No biliary dilatation. Pancreas: No ductal dilatation or inflammation. Spleen: Normal in size without focal abnormality. Adrenals/Urinary Tract: Normal adrenal glands. Obstructing 4 x 4 mm stone in the distal left ureter just proximal to the ureterovesicular junction with moderate left hydroureteronephrosis. Moderate left perinephric edema. Multiple, at least 7, nonobstructing stones in the left kidney. No right hydronephrosis or hydroureter. Multiple, at least 8, nonobstructing stones in the right kidney. Urinary bladder is physiologically distended without stone or wall thickening. Stomach/Bowel: Diverticulosis throughout the entire colon without acute diverticulitis. Appendix not confidently visualized. No small bowel dilatation or inflammation. Stomach is nondistended. Vascular/Lymphatic: Mild aortic and branch atherosclerosis  without aneurysm. No abdominal or pelvic adenopathy. Reproductive: Prostate is unremarkable. Other: No free air, free fluid, or intra-abdominal fluid collection. Tiny fat containing umbilical hernia. Musculoskeletal: There are no acute or suspicious osseous abnormalities. IMPRESSION: 1. Obstructing 4 mm stone in the distal left ureter just proximal to the ureterovesicular junction with moderate hydronephrosis and perinephric edema. 2. Multiple additional nonobstructing bilateral renal calculi. 3. Multifocal colonic diverticulosis throughout the entire colon. No acute diverticulitis. 4. Hepatic steatosis. 5.  Aortic Atherosclerosis (ICD10-I70.0). Electronically Signed   By: Jeb Levering M.D.   On: 11/09/2016 05:42    Assessment & Plan:   There are no diagnoses linked to this encounter.   No orders of the defined types were placed in this encounter.    Follow-up: No follow-ups on file.  Walker Kehr, MD

## 2017-09-11 NOTE — Patient Instructions (Signed)
CT cardiac scoring test $150

## 2017-09-11 NOTE — Assessment & Plan Note (Signed)
On B12 

## 2017-09-11 NOTE — Assessment & Plan Note (Signed)

## 2017-09-11 NOTE — Assessment & Plan Note (Signed)
Labs

## 2017-09-11 NOTE — Assessment & Plan Note (Signed)
Toprol 

## 2017-09-12 ENCOUNTER — Other Ambulatory Visit: Payer: Self-pay | Admitting: Internal Medicine

## 2017-09-12 DIAGNOSIS — E034 Atrophy of thyroid (acquired): Secondary | ICD-10-CM

## 2017-11-10 DIAGNOSIS — R69 Illness, unspecified: Secondary | ICD-10-CM | POA: Diagnosis not present

## 2017-12-04 DIAGNOSIS — Z85828 Personal history of other malignant neoplasm of skin: Secondary | ICD-10-CM | POA: Diagnosis not present

## 2017-12-04 DIAGNOSIS — L814 Other melanin hyperpigmentation: Secondary | ICD-10-CM | POA: Diagnosis not present

## 2017-12-04 DIAGNOSIS — L821 Other seborrheic keratosis: Secondary | ICD-10-CM | POA: Diagnosis not present

## 2017-12-04 DIAGNOSIS — D225 Melanocytic nevi of trunk: Secondary | ICD-10-CM | POA: Diagnosis not present

## 2017-12-04 DIAGNOSIS — D1801 Hemangioma of skin and subcutaneous tissue: Secondary | ICD-10-CM | POA: Diagnosis not present

## 2017-12-14 ENCOUNTER — Other Ambulatory Visit: Payer: Self-pay | Admitting: Internal Medicine

## 2018-06-11 ENCOUNTER — Other Ambulatory Visit: Payer: Self-pay | Admitting: Internal Medicine

## 2018-07-10 IMAGING — CT CT RENAL STONE PROTOCOL
2 of 3 series · 16 of 46 positions shown, 18 images · non-contrast
Comparison: CT 08/03/2014

CLINICAL DATA: Left flank pain.

EXAM:
CT ABDOMEN AND PELVIS WITHOUT CONTRAST
TECHNIQUE: Multidetector CT imaging of the abdomen and pelvis was performed
following the standard protocol without IV contrast.

[Series 3: lung · axial · 0.75mm/px · z∈[+1500,+1576]mm · 13 of 44 slices shown, 15 images]
[im 3/44  soft-tissue]
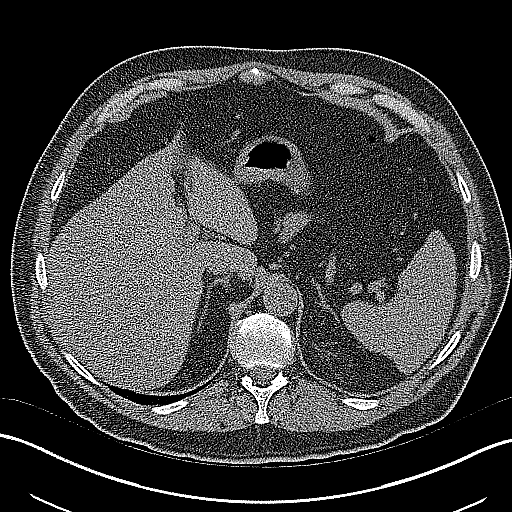
[im 3/44  bone]
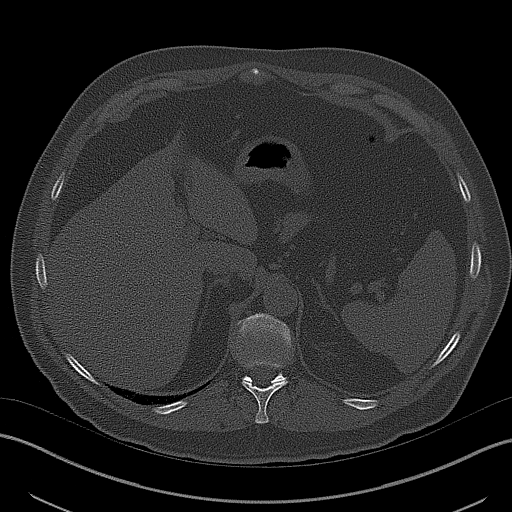
[im 6/44  soft-tissue]
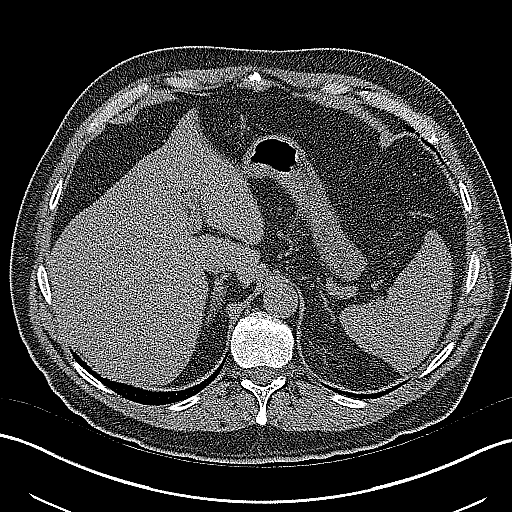
[im 9/44  soft-tissue]
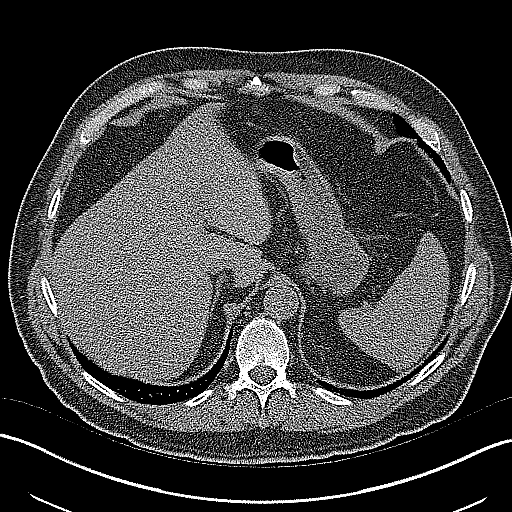
[im 13/44  soft-tissue]
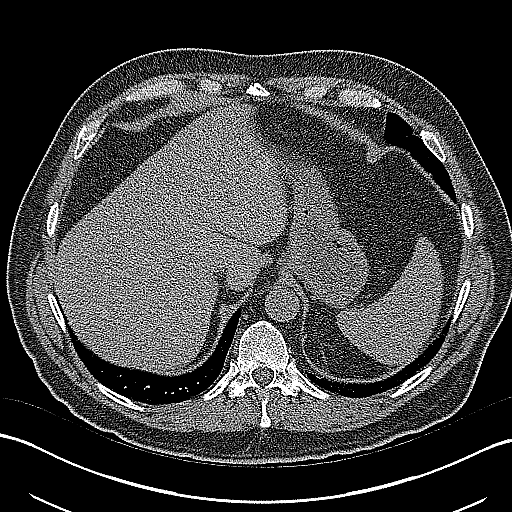
[im 16/44  soft-tissue]
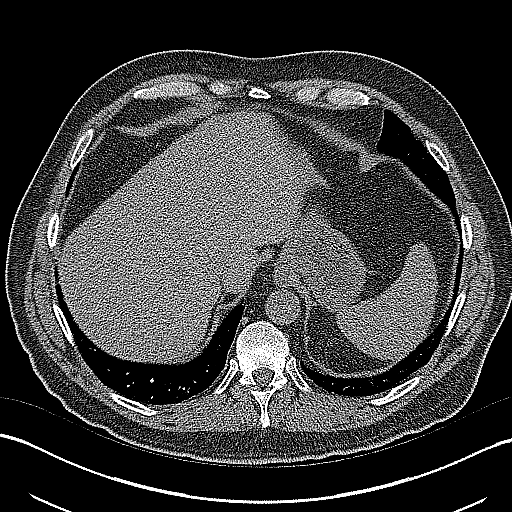
[im 19/44  soft-tissue]
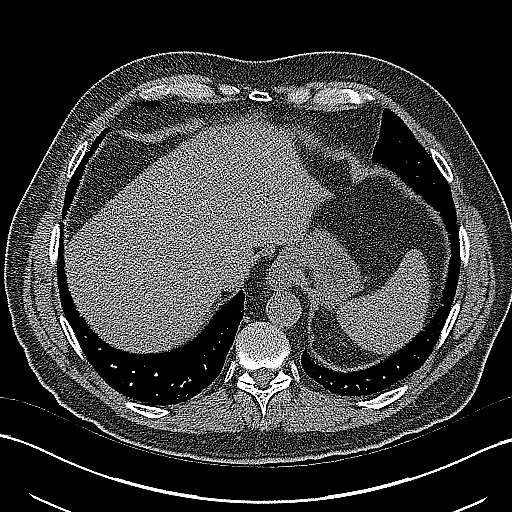
[im 23/44  soft-tissue]
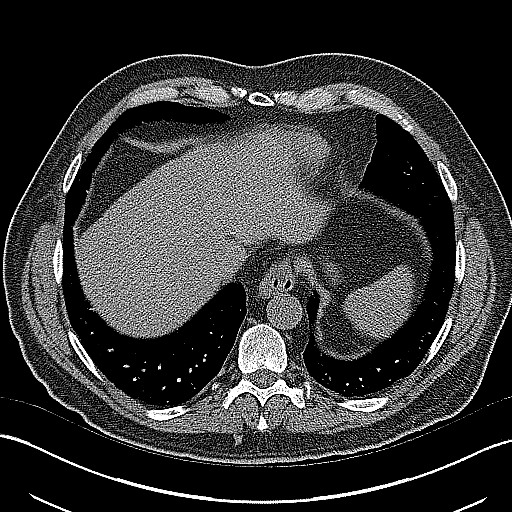
[im 25/44  soft-tissue]
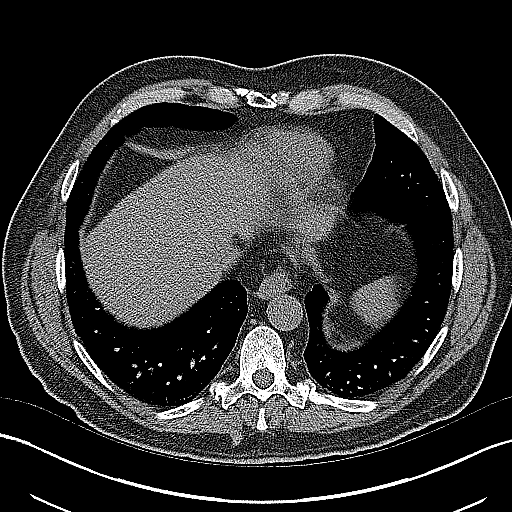
[im 28/44  soft-tissue]
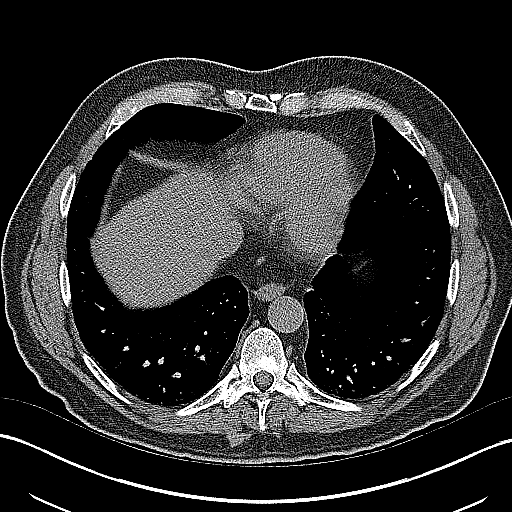
[im 28/44  bone]
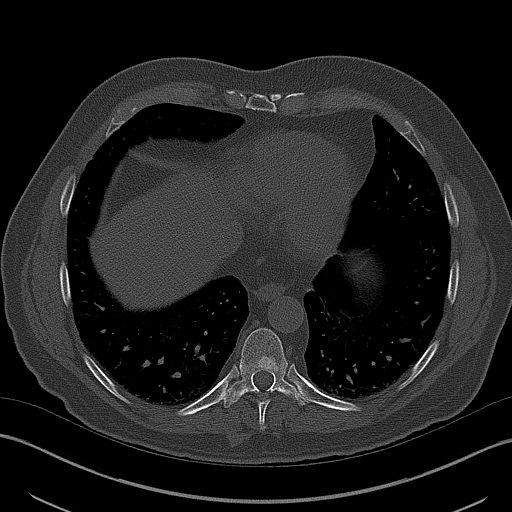
[im 31/44  soft-tissue]
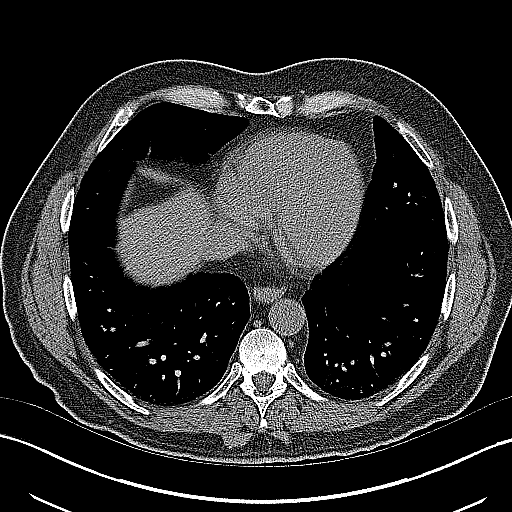
[im 35/44  soft-tissue]
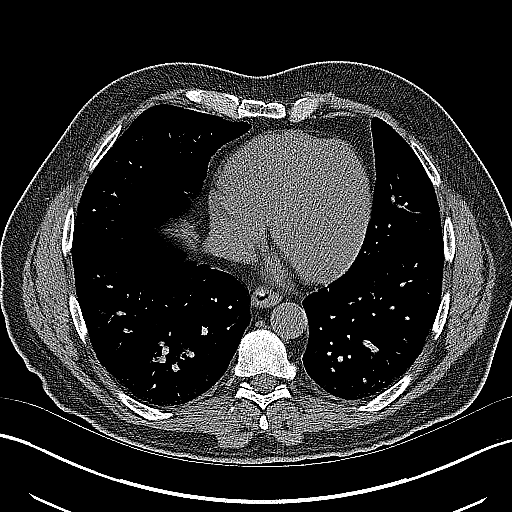
[im 38/44  soft-tissue]
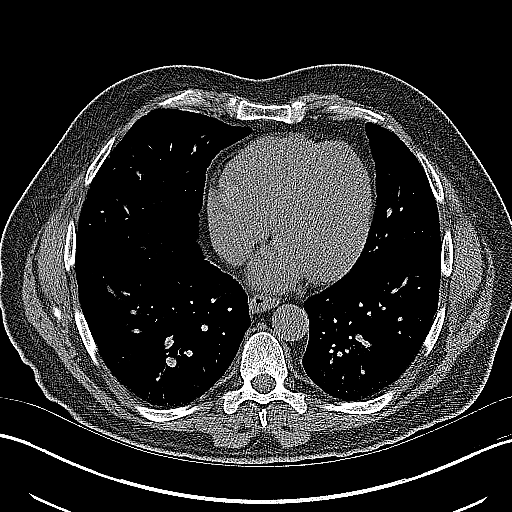
[im 41/44  soft-tissue]
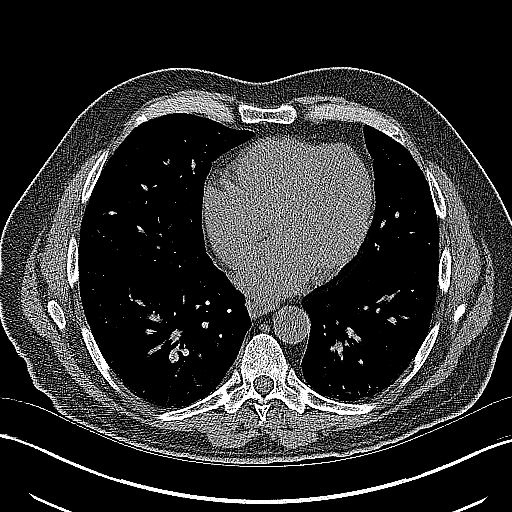

[Series 4: coronal · coronal · 0.71mm/px · 3 of 139 slices shown]
[im 47/139  soft-tissue]
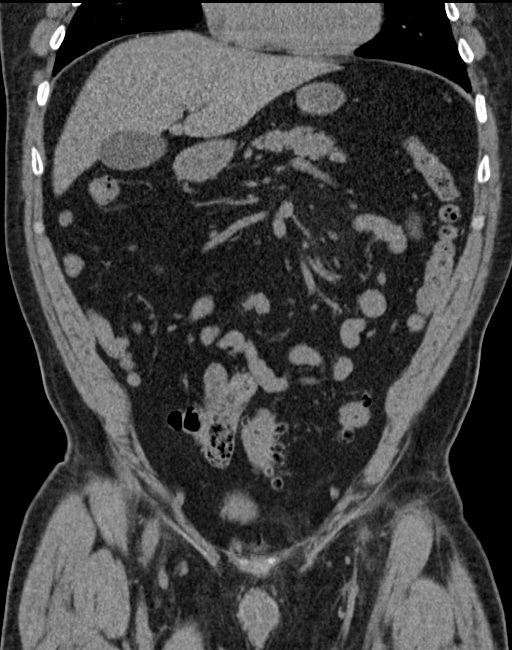
[im 62/139  soft-tissue]
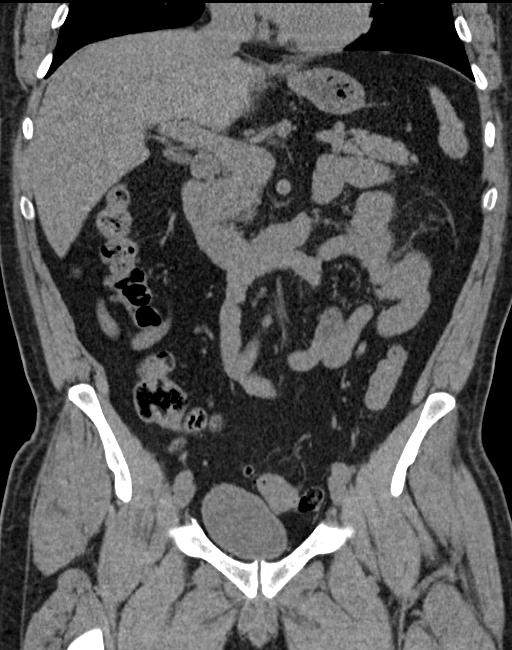
[im 77/139  soft-tissue]
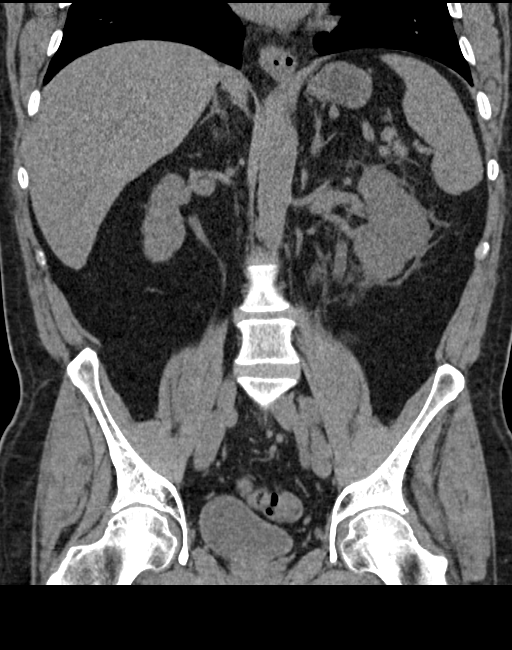

[16 of 46 positions shown; findings below may reference images not displayed]

FINDINGS: Lower chest: The lung bases are clear.

Hepatobiliary: Mild hepatic steatosis, no evidence of focal lesion.
Gallbladder physiologically distended, no calcified stone. No
biliary dilatation.

Pancreas: No ductal dilatation or inflammation.

Spleen: Normal in size without focal abnormality.

Adrenals/Urinary Tract: Normal adrenal glands.

Obstructing 4 x 4 mm stone in the distal left ureter just proximal
to the ureterovesicular junction with moderate left
hydroureteronephrosis. Moderate left perinephric edema. Multiple, at
least 7, nonobstructing stones in the left kidney.

No right hydronephrosis or hydroureter. Multiple, at least 8,
nonobstructing stones in the right kidney.

Urinary bladder is physiologically distended without stone or wall
thickening.

Stomach/Bowel: Diverticulosis throughout the entire colon without
acute diverticulitis. Appendix not confidently visualized. No small
bowel dilatation or inflammation. Stomach is nondistended.

Vascular/Lymphatic: Mild aortic and branch atherosclerosis without
aneurysm. No abdominal or pelvic adenopathy.

Reproductive: Prostate is unremarkable.

Other: No free air, free fluid, or intra-abdominal fluid collection.
Tiny fat containing umbilical hernia.

Musculoskeletal: There are no acute or suspicious osseous
abnormalities.
IMPRESSION: 1. Obstructing 4 mm stone in the distal left ureter just proximal to
the ureterovesicular junction with moderate hydronephrosis and
perinephric edema.
2. Multiple additional nonobstructing bilateral renal calculi.
3. Multifocal colonic diverticulosis throughout the entire colon. No
acute diverticulitis.
4. Hepatic steatosis.
5.  Aortic Atherosclerosis ([id number]).

## 2018-09-13 ENCOUNTER — Encounter: Payer: Medicare HMO | Admitting: Internal Medicine

## 2018-09-26 ENCOUNTER — Other Ambulatory Visit: Payer: Self-pay

## 2018-09-26 ENCOUNTER — Encounter: Payer: Self-pay | Admitting: Internal Medicine

## 2018-09-26 ENCOUNTER — Ambulatory Visit (INDEPENDENT_AMBULATORY_CARE_PROVIDER_SITE_OTHER): Payer: Medicare HMO | Admitting: Internal Medicine

## 2018-09-26 ENCOUNTER — Other Ambulatory Visit (INDEPENDENT_AMBULATORY_CARE_PROVIDER_SITE_OTHER): Payer: Medicare HMO

## 2018-09-26 VITALS — BP 158/92 | HR 56 | Temp 98.2°F | Ht 69.0 in | Wt 201.0 lb

## 2018-09-26 DIAGNOSIS — I10 Essential (primary) hypertension: Secondary | ICD-10-CM

## 2018-09-26 DIAGNOSIS — E785 Hyperlipidemia, unspecified: Secondary | ICD-10-CM | POA: Diagnosis not present

## 2018-09-26 DIAGNOSIS — E034 Atrophy of thyroid (acquired): Secondary | ICD-10-CM | POA: Diagnosis not present

## 2018-09-26 DIAGNOSIS — Z23 Encounter for immunization: Secondary | ICD-10-CM | POA: Diagnosis not present

## 2018-09-26 DIAGNOSIS — Z125 Encounter for screening for malignant neoplasm of prostate: Secondary | ICD-10-CM | POA: Diagnosis not present

## 2018-09-26 DIAGNOSIS — Z0000 Encounter for general adult medical examination without abnormal findings: Secondary | ICD-10-CM | POA: Diagnosis not present

## 2018-09-26 DIAGNOSIS — E538 Deficiency of other specified B group vitamins: Secondary | ICD-10-CM

## 2018-09-26 LAB — URINALYSIS
Bilirubin Urine: NEGATIVE
Hgb urine dipstick: NEGATIVE
Ketones, ur: NEGATIVE
Leukocytes,Ua: NEGATIVE
Nitrite: NEGATIVE
Specific Gravity, Urine: 1.02 (ref 1.000–1.030)
Total Protein, Urine: NEGATIVE
Urine Glucose: NEGATIVE
Urobilinogen, UA: 0.2 (ref 0.0–1.0)
pH: 7 (ref 5.0–8.0)

## 2018-09-26 LAB — PSA: PSA: 0.45 ng/mL (ref 0.10–4.00)

## 2018-09-26 LAB — BASIC METABOLIC PANEL
BUN: 16 mg/dL (ref 6–23)
CO2: 30 mEq/L (ref 19–32)
Calcium: 9.6 mg/dL (ref 8.4–10.5)
Chloride: 103 mEq/L (ref 96–112)
Creatinine, Ser: 0.97 mg/dL (ref 0.40–1.50)
GFR: 77.18 mL/min (ref >60.00)
Glucose, Bld: 94 mg/dL (ref 70–99)
Potassium: 4.7 mEq/L (ref 3.5–5.1)
Sodium: 140 mEq/L (ref 135–145)

## 2018-09-26 LAB — CBC WITH DIFFERENTIAL/PLATELET
Basophils Absolute: 0 10*3/uL (ref 0.0–0.1)
Basophils Relative: 0.8 % (ref 0.0–3.0)
Eosinophils Absolute: 0.4 10*3/uL (ref 0.0–0.7)
Eosinophils Relative: 7.3 % — ABNORMAL HIGH (ref 0.0–5.0)
HCT: 41 % (ref 39.0–52.0)
Hemoglobin: 14 g/dL (ref 13.0–17.0)
Lymphocytes Relative: 30.3 % (ref 12.0–46.0)
Lymphs Abs: 1.5 10*3/uL (ref 0.7–4.0)
MCHC: 34.1 g/dL (ref 30.0–36.0)
MCV: 86.3 fl (ref 78.0–100.0)
Monocytes Absolute: 0.5 10*3/uL (ref 0.1–1.0)
Monocytes Relative: 10.2 % (ref 3.0–12.0)
Neutro Abs: 2.5 10*3/uL (ref 1.4–7.7)
Neutrophils Relative %: 51.4 % (ref 43.0–77.0)
Platelets: 189 10*3/uL (ref 150.0–400.0)
RBC: 4.75 Mil/uL (ref 4.22–5.81)
RDW: 12.6 % (ref 11.5–15.5)
WBC: 4.9 10*3/uL (ref 4.0–10.5)

## 2018-09-26 LAB — HEPATIC FUNCTION PANEL
ALT: 22 U/L (ref 0–53)
AST: 23 U/L (ref 0–37)
Albumin: 4.4 g/dL (ref 3.5–5.2)
Alkaline Phosphatase: 58 U/L (ref 39–117)
Bilirubin, Direct: 0.1 mg/dL (ref 0.0–0.3)
Total Bilirubin: 0.4 mg/dL (ref 0.2–1.2)
Total Protein: 7.2 g/dL (ref 6.0–8.3)

## 2018-09-26 LAB — LIPID PANEL
Cholesterol: 181 mg/dL (ref 0–200)
HDL: 39.2 mg/dL (ref >39.00)
NonHDL: 141.69
Total CHOL/HDL Ratio: 5
Triglycerides: 247 mg/dL — ABNORMAL HIGH (ref 0.0–149.0)
VLDL: 49.4 mg/dL — ABNORMAL HIGH (ref 0.0–40.0)

## 2018-09-26 LAB — VITAMIN B12: Vitamin B-12: 817 pg/mL (ref 211–911)

## 2018-09-26 LAB — LDL CHOLESTEROL, DIRECT: Direct LDL: 98 mg/dL

## 2018-09-26 LAB — TSH: TSH: 3.5 u[IU]/mL (ref 0.35–4.50)

## 2018-09-26 MED ORDER — METOPROLOL SUCCINATE ER 25 MG PO TB24: 25.0000 mg | ORAL_TABLET | Freq: Every day | ORAL | 3 refills | Status: DC

## 2018-09-26 NOTE — Assessment & Plan Note (Signed)
A cardiac CT scan for coronary calcium offered  Colon next year Pneumovax We discussed age appropriate health related issues, including available/recomended screening tests and vaccinations. We discussed a need for adhering to healthy diet and exercise. Labs were ordered to be later reviewed . All questions were answered.

## 2018-09-26 NOTE — Assessment & Plan Note (Signed)
Chronic On Levothroid - 

## 2018-09-26 NOTE — Assessment & Plan Note (Signed)
Vit B12 

## 2018-09-26 NOTE — Assessment & Plan Note (Signed)
On Toprol XL Labs

## 2018-09-26 NOTE — Progress Notes (Signed)
Subjective:  Patient ID: Shane Short, male    DOB: 1950-10-02  Age: 68 y.o. MRN: [id number]  CC: Annual Exam   HPI LINARD DAFT presents for a well exam F/u hypothyroidism, DM  Outpatient Medications Prior to Visit  Medication Sig Dispense Refill   aspirin 81 MG EC tablet Take 81 mg by mouth daily after breakfast.       Cholecalciferol (VITAMIN D3) 1000 UNITS tablet Take 1,000 Units by mouth daily.       levothyroxine (SYNTHROID, LEVOTHROID) 125 MCG tablet TAKE 1 TABLET BY MOUTH ONCE DAILY BEFORE BREAKFAST 90 tablet 3   metoprolol succinate (TOPROL-XL) 25 MG 24 hr tablet TAKE 1 TABLET BY MOUTH ONCE DAILY 31 tablet 11   saw palmetto 160 MG capsule 1 po qd 100 capsule 3   metoprolol succinate (TOPROL-XL) 25 MG 24 hr tablet Take 0.5 tablets (12.5 mg total) by mouth daily. 30 tablet 11   No facility-administered medications prior to visit.     ROS: Review of Systems  Constitutional: Negative for appetite change, fatigue and unexpected weight change.  HENT: Negative for congestion, nosebleeds, sneezing, sore throat and trouble swallowing.   Eyes: Negative for itching and visual disturbance.  Respiratory: Negative for cough.   Cardiovascular: Negative for chest pain, palpitations and leg swelling.  Gastrointestinal: Negative for abdominal distention, blood in stool, diarrhea and nausea.  Genitourinary: Negative for frequency and hematuria.  Musculoskeletal: Negative for back pain, gait problem, joint swelling and neck pain.  Skin: Negative for rash.  Neurological: Negative for dizziness, tremors, speech difficulty and weakness.  Psychiatric/Behavioral: Negative for agitation, dysphoric mood and sleep disturbance. The patient is not nervous/anxious.     Objective:  BP (!) 158/92 (BP Location: Left Arm, Patient Position: Sitting, Cuff Size: Large)    Pulse (!) 56    Temp 98.2 F (36.8 C) (Oral)    Ht 5\' 9"  (1.753 m)    Wt 201 lb (91.2 kg)    SpO2 97%    BMI 29.68 kg/m     BP Readings from Last 3 Encounters:  09/26/18 (!) 158/92  09/11/17 126/80  03/14/17 120/78    Wt Readings from Last 3 Encounters:  09/26/18 201 lb (91.2 kg)  09/11/17 194 lb (88 kg)  03/14/17 204 lb (92.5 kg)    Physical Exam Constitutional:      General: He is not in acute distress.    Appearance: He is well-developed.     Comments: NAD  Eyes:     Conjunctiva/sclera: Conjunctivae normal.     Pupils: Pupils are equal, round, and reactive to light.  Neck:     Musculoskeletal: Normal range of motion.     Thyroid: No thyromegaly.     Vascular: No JVD.  Cardiovascular:     Rate and Rhythm: Normal rate and regular rhythm.     Heart sounds: Normal heart sounds. No murmur. No friction rub. No gallop.   Pulmonary:     Effort: Pulmonary effort is normal. No respiratory distress.     Breath sounds: Normal breath sounds. No wheezing or rales.  Chest:     Chest wall: No tenderness.  Abdominal:     General: Bowel sounds are normal. There is no distension.     Palpations: Abdomen is soft. There is no mass.     Tenderness: There is no abdominal tenderness. There is no guarding or rebound.  Genitourinary:    Prostate: Normal.     Rectum: Normal. Guaiac result negative.  Musculoskeletal: Normal range of motion.        General: No tenderness.  Lymphadenopathy:     Cervical: No cervical adenopathy.  Skin:    General: Skin is warm and dry.     Findings: No rash.  Neurological:     Mental Status: He is alert and oriented to person, place, and time.     Cranial Nerves: No cranial nerve deficit.     Motor: No abnormal muscle tone.     Coordination: Coordination normal.     Gait: Gait normal.     Deep Tendon Reflexes: Reflexes are normal and symmetric.  Psychiatric:        Behavior: Behavior normal.        Thought Content: Thought content normal.        Judgment: Judgment normal.     Lab Results  Component Value Date   WBC 5.6 09/11/2017   HGB 13.7 09/11/2017   HCT 40.4  09/11/2017   PLT 194.0 09/11/2017   GLUCOSE 99 09/11/2017   CHOL 136 09/11/2017   TRIG 170.0 (H) 09/11/2017   HDL 27.40 (L) 09/11/2017   LDLCALC 74 09/11/2017   ALT 23 09/11/2017   AST 22 09/11/2017   NA 140 09/11/2017   K 4.9 09/11/2017   CL 103 09/11/2017   CREATININE 1.12 09/11/2017   BUN 11 09/11/2017   CO2 29 09/11/2017   TSH 5.81 (H) 09/11/2017   PSA 0.50 09/11/2017    Ct Renal Stone Study  Result Date: 11/09/2016 CLINICAL DATA:  Left flank pain. EXAM: CT ABDOMEN AND PELVIS WITHOUT CONTRAST TECHNIQUE: Multidetector CT imaging of the abdomen and pelvis was performed following the standard protocol without IV contrast. COMPARISON:  CT 08/03/2014 FINDINGS: Lower chest: The lung bases are clear. Hepatobiliary: Mild hepatic steatosis, no evidence of focal lesion. Gallbladder physiologically distended, no calcified stone. No biliary dilatation. Pancreas: No ductal dilatation or inflammation. Spleen: Normal in size without focal abnormality. Adrenals/Urinary Tract: Normal adrenal glands. Obstructing 4 x 4 mm stone in the distal left ureter just proximal to the ureterovesicular junction with moderate left hydroureteronephrosis. Moderate left perinephric edema. Multiple, at least 7, nonobstructing stones in the left kidney. No right hydronephrosis or hydroureter. Multiple, at least 8, nonobstructing stones in the right kidney. Urinary bladder is physiologically distended without stone or wall thickening. Stomach/Bowel: Diverticulosis throughout the entire colon without acute diverticulitis. Appendix not confidently visualized. No small bowel dilatation or inflammation. Stomach is nondistended. Vascular/Lymphatic: Mild aortic and branch atherosclerosis without aneurysm. No abdominal or pelvic adenopathy. Reproductive: Prostate is unremarkable. Other: No free air, free fluid, or intra-abdominal fluid collection. Tiny fat containing umbilical hernia. Musculoskeletal: There are no acute or suspicious  osseous abnormalities. IMPRESSION: 1. Obstructing 4 mm stone in the distal left ureter just proximal to the ureterovesicular junction with moderate hydronephrosis and perinephric edema. 2. Multiple additional nonobstructing bilateral renal calculi. 3. Multifocal colonic diverticulosis throughout the entire colon. No acute diverticulitis. 4. Hepatic steatosis. 5.  Aortic Atherosclerosis (ICD10-I70.0). Electronically Signed   By: Jeb Levering M.D.   On: 11/09/2016 05:42    Assessment & Plan:   There are no diagnoses linked to this encounter.   No orders of the defined types were placed in this encounter.    Follow-up: No follow-ups on file.  Walker Kehr, MD

## 2018-09-26 NOTE — Assessment & Plan Note (Signed)
A cardiac CT scan for coronary calcium offered 

## 2018-09-26 NOTE — Patient Instructions (Addendum)
If you have medicare related insurance (such as traditional Medicare, [employer], [employer], or similar), Please make an appointment at the scheduling desk with Sharee Pimple, the [employer], for your Wellness visit in this office, which is a benefit with your insurance.     Cardiac CT calcium scoring test $150   Computed tomography, more commonly known as a CT or CAT scan, is a diagnostic medical imaging test. Like traditional x-rays, it produces multiple images or pictures of the inside of the body. The cross-sectional images generated during a CT scan can be reformatted in multiple planes. They can even generate three-dimensional images. These images can be viewed on a computer monitor, printed on film or by a 3D printer, or transferred to a CD or DVD. CT images of internal organs, bones, soft tissue and blood vessels provide greater detail than traditional x-rays, particularly of soft tissues and blood vessels. A cardiac CT scan for coronary calcium is a non-invasive way of obtaining information about the presence, location and extent of calcified plaque in the coronary arteries-the vessels that supply oxygen-containing blood to the heart muscle. Calcified plaque results when there is a build-up of fat and other substances under the inner layer of the artery. This material can calcify which signals the presence of atherosclerosis, a disease of the vessel wall, also called coronary artery disease (CAD). People with this disease have an increased risk for heart attacks. In addition, over time, progression of plaque build up (CAD) can narrow the arteries or even close off blood flow to the heart. The result may be chest pain, sometimes called "angina," or a heart attack. Because calcium is a marker of CAD, the amount of calcium detected on a cardiac CT scan is a helpful prognostic tool. The findings on cardiac CT are expressed as a calcium score. Another name for this test is  coronary artery calcium scoring.  What are some common uses of the procedure? The goal of cardiac CT scan for calcium scoring is to determine if CAD is present and to what extent, even if there are no symptoms. It is a screening study that may be recommended by a physician for patients with risk factors for CAD but no clinical symptoms. The major risk factors for CAD are: . high blood cholesterol levels  . family history of heart attacks  . diabetes  . high blood pressure  . cigarette smoking  . overweight or obese  . physical inactivity   A negative cardiac CT scan for calcium scoring shows no calcification within the coronary arteries. This suggests that CAD is absent or so minimal it cannot be seen by this technique. The chance of having a heart attack over the next two to five years is very low under these circumstances. A positive test means that CAD is present, regardless of whether or not the patient is experiencing any symptoms. The amount of calcification-expressed as the calcium score-may help to predict the likelihood of a myocardial infarction (heart attack) in the coming years and helps your medical doctor or cardiologist decide whether the patient may need to take preventive medicine or undertake other measures such as diet and exercise to lower the risk for heart attack. The extent of CAD is graded according to your calcium score:  Calcium Score  Presence of CAD (coronary artery disease)  0 No evidence of CAD   1-10 Minimal evidence of CAD  11-100 Mild evidence of CAD  101-400 Moderate evidence of CAD  Over 400 Extensive evidence of CAD    These suggestions will probably help you to improve your metabolism if you are not overweight and to lose weight if you are overweight: 1.  Reduce your consumption of sugars and starches.  Eliminate high fructose corn syrup from your diet.  Reduce your consumption of processed foods.  For desserts try to have seasonal fruits, berries,  nuts, cheeses or dark chocolate with more than 70% cacao. 2.  Do not snack 3.  You do not have to eat breakfast.  If you choose to have breakfast-eat plain greek yogurt, eggs, oatmeal (without sugar) 4.  Drink water, freshly brewed unsweetened tea (green, black or herbal) or coffee.  Do not drink sodas including diet sodas , juices, beverages sweetened with artificial sweeteners. 5.  Reduce your consumption of refined grains. 6.  Avoid protein drinks such as Optifast, Slim fast etc. Eat chicken, fish, meat, dairy and beans for your sources of protein 7.  Natural unprocessed fats like cold pressed virgin olive oil, butter, coconut oil are good for you.  Eat avocados 8.  Increase your consumption of fiber.  Fruits, berries, vegetables, whole grains, flaxseeds, Chia seeds, beans, popcorn, nuts, oatmeal are good sources of fiber 9.  Use vinegar in your diet, i.e. apple cider vinegar, red wine or balsamic vinegar 10.  You can try fasting.  For example you can skip breakfast and lunch every other day (24-hour fast) 11.  Stress reduction, good night sleep, relaxation, meditation, yoga and other physical activity is likely to help you to maintain low weight too. 12.  If you drink alcohol, limit your alcohol intake to no more than 2 drinks a day.   Mediterranean diet is good for you. ([employer] has a typical Mediterranean cuisine menu) The Mediterranean diet is a way of eating based on the traditional cuisine of countries bordering the [employer]. While there is no single definition of the Mediterranean diet, it is typically high in vegetables, fruits, whole grains, beans, nut and seeds, and olive oil. The main components of Mediterranean diet include: Marland Kitchen Daily consumption of vegetables, fruits, whole grains and healthy fats  . Weekly intake of fish, poultry, beans and eggs  . Moderate portions of dairy products  . Limited intake of red meat Other important elements of the Mediterranean diet  are sharing meals with family and friends, enjoying a glass of red wine and being physically active. Health benefits of a Mediterranean diet: A traditional Mediterranean diet consisting of large quantities of fresh fruits and vegetables, nuts, fish and olive oil-coupled with physical activity-can reduce your risk of serious mental and physical health problems by: Preventing heart disease and strokes. Following a Mediterranean diet limits your intake of refined breads, processed foods, and red meat, and encourages drinking red wine instead of hard liquor-all factors that can help prevent heart disease and stroke. Keeping you agile. If you're an older adult, the nutrients gained with a Mediterranean diet may reduce your risk of developing muscle weakness and other signs of frailty by about 70 percent. Reducing the risk of Alzheimer's. Research suggests that the [place] diet may improve cholesterol, blood sugar levels, and overall blood vessel health, which in turn may reduce your risk of Alzheimer's disease or dementia. Halving the risk of Parkinson's disease. The high levels of antioxidants in the Mediterranean diet can prevent cells from undergoing a damaging process called oxidative stress, thereby cutting the risk of Parkinson's disease in half. Increasing longevity. By reducing your risk  of developing heart disease or cancer with the Mediterranean diet, you're reducing your risk of death at any age by 20%. Protecting against type 2 diabetes. A Mediterranean diet is rich in fiber which digests slowly, prevents huge swings in blood sugar, and can help you maintain a healthy weight.    Cabbage soup recipe that will not make you gain weight: Take 1 small head of cabbage, 1 average pack of celery, 4 green peppers, 4 onions, 2 cans diced tomatoes (they are not available without salt), salt and spices to taste.  Chop cabbage, celery, peppers and onions.  And tomatoes and 2-2.5 liters (2.5 quarts) of  water so that it would just cover the vegetables.  Bring to boil.  Add spices and salt.  Turn heat to low/medium and simmer for 20-25 minutes.  Naturally, you can make a smaller batch and change some of the ingredients.

## 2018-11-09 DIAGNOSIS — R69 Illness, unspecified: Secondary | ICD-10-CM | POA: Diagnosis not present

## 2018-12-05 DIAGNOSIS — D1801 Hemangioma of skin and subcutaneous tissue: Secondary | ICD-10-CM | POA: Diagnosis not present

## 2018-12-05 DIAGNOSIS — D225 Melanocytic nevi of trunk: Secondary | ICD-10-CM | POA: Diagnosis not present

## 2018-12-05 DIAGNOSIS — C4441 Basal cell carcinoma of skin of scalp and neck: Secondary | ICD-10-CM | POA: Diagnosis not present

## 2018-12-05 DIAGNOSIS — L570 Actinic keratosis: Secondary | ICD-10-CM | POA: Diagnosis not present

## 2018-12-05 DIAGNOSIS — D485 Neoplasm of uncertain behavior of skin: Secondary | ICD-10-CM | POA: Diagnosis not present

## 2018-12-05 DIAGNOSIS — L820 Inflamed seborrheic keratosis: Secondary | ICD-10-CM | POA: Diagnosis not present

## 2018-12-05 DIAGNOSIS — L821 Other seborrheic keratosis: Secondary | ICD-10-CM | POA: Diagnosis not present

## 2019-02-04 DIAGNOSIS — C4441 Basal cell carcinoma of skin of scalp and neck: Secondary | ICD-10-CM | POA: Diagnosis not present

## 2019-03-12 ENCOUNTER — Telehealth: Payer: Self-pay | Admitting: Internal Medicine

## 2019-03-12 NOTE — Telephone Encounter (Signed)
    Pt c/o medication issue:  1. Name of Medication: levothyroxine    2. How are you currently taking this medication (dosage and times per day)? As written  3. Are you having a reaction (difficulty breathing--STAT)? no  4. What is your medication issue? Pharmacy calling, request to change to different manufacture

## 2019-03-12 NOTE — Telephone Encounter (Signed)
Okay given to pharmacy

## 2019-03-19 NOTE — Telephone Encounter (Signed)
Pt's wife stated the pharmacy said they have not gotten okay to refill levothyroxine (SYNTHROID, LEVOTHROID) 125 MCG tablet With new manufacturer. Pt is out of medication. Needs refill.

## 2019-03-19 NOTE — Telephone Encounter (Signed)
Okay regiven to pharmacy

## 2019-03-31 ENCOUNTER — Ambulatory Visit: Payer: Medicare HMO

## 2019-04-05 ENCOUNTER — Ambulatory Visit: Payer: Medicare HMO

## 2019-04-06 ENCOUNTER — Ambulatory Visit: Payer: Medicare HMO | Attending: Internal Medicine

## 2019-04-06 DIAGNOSIS — Z23 Encounter for immunization: Secondary | ICD-10-CM

## 2019-04-06 NOTE — Progress Notes (Signed)
   Covid-19 Vaccination Clinic  Name:  BRANDON SIMILIEN    MRN: [id number] DOB: 1951/02/01  04/06/2019  Mr. Gesner was observed post Covid-19 immunization for 15 minutes without incidence. He was provided with Vaccine Information Sheet and instruction to access the V-Safe system.   Mr. Mention was instructed to call 911 with any severe reactions post vaccine: Marland Kitchen Difficulty breathing  . Swelling of your face and throat  . A fast heartbeat  . A bad rash all over your body  . Dizziness and weakness    Immunizations Administered    Name Date Dose VIS Date Route   Pfizer COVID-19 Vaccine 04/06/2019 10:31 AM 0.3 mL 02/08/2019 Intramuscular   Manufacturer: [place]   Lot: [id number]   [place]: [id number]

## 2019-04-30 ENCOUNTER — Ambulatory Visit: Payer: Medicare HMO | Attending: Internal Medicine

## 2019-04-30 DIAGNOSIS — Z23 Encounter for immunization: Secondary | ICD-10-CM | POA: Insufficient documentation

## 2019-04-30 NOTE — Progress Notes (Signed)
   Covid-19 Vaccination Clinic  Name:  Shane Short    MRN: [id number] DOB: August 05, 1950  04/30/2019  Shane Short was observed post Covid-19 immunization for 15 minutes without incident. He was provided with Vaccine Information Sheet and instruction to access the V-Safe system.   Shane Short was instructed to call 911 with any severe reactions post vaccine: Marland Kitchen Difficulty breathing  . Swelling of face and throat  . A fast heartbeat  . A bad rash all over body  . Dizziness and weakness   Immunizations Administered    Name Date Dose VIS Date Route   Pfizer COVID-19 Vaccine 04/30/2019 10:02 AM 0.3 mL 02/08/2019 Intramuscular   Manufacturer: [place]   Lot: [id number]   [place]: [id number]

## 2019-06-17 ENCOUNTER — Other Ambulatory Visit: Payer: Self-pay | Admitting: Internal Medicine

## 2019-09-16 ENCOUNTER — Other Ambulatory Visit: Payer: Self-pay | Admitting: Internal Medicine

## 2019-09-30 ENCOUNTER — Other Ambulatory Visit: Payer: Self-pay

## 2019-09-30 ENCOUNTER — Ambulatory Visit (INDEPENDENT_AMBULATORY_CARE_PROVIDER_SITE_OTHER): Payer: Medicare HMO | Admitting: Internal Medicine

## 2019-09-30 ENCOUNTER — Encounter: Payer: Self-pay | Admitting: Internal Medicine

## 2019-09-30 VITALS — BP 130/76 | HR 54 | Temp 98.0°F | Ht 69.0 in | Wt 206.0 lb

## 2019-09-30 DIAGNOSIS — I10 Essential (primary) hypertension: Secondary | ICD-10-CM | POA: Diagnosis not present

## 2019-09-30 DIAGNOSIS — E034 Atrophy of thyroid (acquired): Secondary | ICD-10-CM | POA: Diagnosis not present

## 2019-09-30 DIAGNOSIS — E538 Deficiency of other specified B group vitamins: Secondary | ICD-10-CM

## 2019-09-30 DIAGNOSIS — Z0000 Encounter for general adult medical examination without abnormal findings: Secondary | ICD-10-CM

## 2019-09-30 DIAGNOSIS — Z1211 Encounter for screening for malignant neoplasm of colon: Secondary | ICD-10-CM

## 2019-09-30 DIAGNOSIS — E785 Hyperlipidemia, unspecified: Secondary | ICD-10-CM | POA: Diagnosis not present

## 2019-09-30 LAB — COMPLETE METABOLIC PANEL WITH GFR
AG Ratio: 1.4 (calc) (ref 1.0–2.5)
ALT: 16 U/L (ref 9–46)
AST: 18 U/L (ref 10–35)
Albumin: 4.2 g/dL (ref 3.6–5.1)
Alkaline phosphatase (APISO): 58 U/L (ref 35–144)
BUN: 13 mg/dL (ref 7–25)
CO2: 29 mmol/L (ref 20–32)
Calcium: 9.4 mg/dL (ref 8.6–10.3)
Chloride: 105 mmol/L (ref 98–110)
Creat: 1.04 mg/dL (ref 0.70–1.25)
GFR, Est African American: 85 mL/min/{1.73_m2} (ref >=60)
GFR, Est Non African American: 73 mL/min/{1.73_m2} (ref >=60)
Globulin: 3 g/dL (calc) (ref 1.9–3.7)
Glucose, Bld: 101 mg/dL — ABNORMAL HIGH (ref 65–99)
Potassium: 4.2 mmol/L (ref 3.5–5.3)
Sodium: 138 mmol/L (ref 135–146)
Total Bilirubin: 0.4 mg/dL (ref 0.2–1.2)
Total Protein: 7.2 g/dL (ref 6.1–8.1)

## 2019-09-30 NOTE — Assessment & Plan Note (Signed)
Labs

## 2019-09-30 NOTE — Assessment & Plan Note (Addendum)
We discussed age appropriate health related issues, including available/recomended screening tests and vaccinations. We discussed a need for adhering to healthy diet and exercise. Labs were ordered to be later reviewed . All questions were answered. A cardiac CT scan for coronary calcium offered 7/20, 7/21 Colon 2011 - due this year

## 2019-09-30 NOTE — Addendum Note (Signed)
Addended by: Steward Ros on: 09/30/2019 08:30 AM   Modules accepted: Orders

## 2019-09-30 NOTE — Patient Instructions (Signed)

## 2019-09-30 NOTE — Assessment & Plan Note (Signed)
On Toprol XL

## 2019-09-30 NOTE — Assessment & Plan Note (Signed)
On B12 

## 2019-09-30 NOTE — Progress Notes (Signed)
Subjective:  Patient ID: Shane Short, male    DOB: 1950-11-22  Age: 69 y.o. MRN: [id number]  CC: No chief complaint on file.   HPI Shane Short presents for a well exam C/o stress  Outpatient Medications Prior to Visit  Medication Sig Dispense Refill  . aspirin 81 MG EC tablet Take 81 mg by mouth daily after breakfast.      . Cholecalciferol (VITAMIN D3) 1000 UNITS tablet Take 1,000 Units by mouth daily.      Marland Kitchen levothyroxine (SYNTHROID) 125 MCG tablet TAKE 1 TABLET BY MOUTH ONCE DAILY BEFORE BREAKFAST 90 tablet 0  . metoprolol succinate (TOPROL-XL) 25 MG 24 hr tablet Take 1 tablet by mouth once daily 90 tablet 3  . saw palmetto 160 MG capsule 1 po qd 100 capsule 3   No facility-administered medications prior to visit.    ROS: Review of Systems  Constitutional: Positive for unexpected weight change. Negative for appetite change and fatigue.  HENT: Negative for congestion, nosebleeds, sneezing, sore throat and trouble swallowing.   Eyes: Negative for itching and visual disturbance.  Respiratory: Negative for cough.   Cardiovascular: Negative for chest pain, palpitations and leg swelling.  Gastrointestinal: Negative for abdominal distention, blood in stool, diarrhea and nausea.  Genitourinary: Negative for frequency and hematuria.  Musculoskeletal: Negative for back pain, gait problem, joint swelling and neck pain.  Skin: Negative for rash.  Neurological: Negative for dizziness, tremors, speech difficulty and weakness.  Psychiatric/Behavioral: Negative for agitation, dysphoric mood and sleep disturbance. The patient is nervous/anxious.     Objective:  BP (!) 130/76 (BP Location: Right Arm, Patient Position: Sitting, Cuff Size: Large)   Pulse 54   Temp 98 F (36.7 C) (Oral)   Ht 5\' 9"  (1.753 m)   Wt (!) 206 lb (93.4 kg)   SpO2 96%   BMI 30.42 kg/m   BP Readings from Last 3 Encounters:  09/30/19 (!) 130/76  09/26/18 (!) 158/92  09/11/17 126/80    Wt Readings  from Last 3 Encounters:  09/30/19 (!) 206 lb (93.4 kg)  09/26/18 201 lb (91.2 kg)  09/11/17 194 lb (88 kg)    Physical Exam Constitutional:      General: He is not in acute distress.    Appearance: He is well-developed. He is obese.     Comments: NAD  Eyes:     Conjunctiva/sclera: Conjunctivae normal.     Pupils: Pupils are equal, round, and reactive to light.  Neck:     Thyroid: No thyromegaly.     Vascular: No JVD.  Cardiovascular:     Rate and Rhythm: Normal rate and regular rhythm.     Heart sounds: Normal heart sounds. No murmur heard.  No friction rub. No gallop.   Pulmonary:     Effort: Pulmonary effort is normal. No respiratory distress.     Breath sounds: Normal breath sounds. No wheezing or rales.  Chest:     Chest wall: No tenderness.  Abdominal:     General: Bowel sounds are normal. There is no distension.     Palpations: Abdomen is soft. There is no mass.     Tenderness: There is no abdominal tenderness. There is no guarding or rebound.  Musculoskeletal:        General: No tenderness. Normal range of motion.     Cervical back: Normal range of motion.  Lymphadenopathy:     Cervical: No cervical adenopathy.  Skin:    General: Skin is warm and  dry.     Findings: No rash.  Neurological:     Mental Status: He is alert and oriented to person, place, and time.     Cranial Nerves: No cranial nerve deficit.     Motor: No abnormal muscle tone.     Coordination: Coordination normal.     Gait: Gait normal.     Deep Tendon Reflexes: Reflexes are normal and symmetric.  Psychiatric:        Behavior: Behavior normal.        Thought Content: Thought content normal.        Judgment: Judgment normal.   rectal per GI   Lab Results  Component Value Date   WBC 4.9 09/26/2018   HGB 14.0 09/26/2018   HCT 41.0 09/26/2018   PLT 189.0 09/26/2018   GLUCOSE 94 09/26/2018   CHOL 181 09/26/2018   TRIG 247.0 (H) 09/26/2018   HDL 39.20 09/26/2018   LDLDIRECT 98.0 09/26/2018    LDLCALC 74 09/11/2017   ALT 22 09/26/2018   AST 23 09/26/2018   NA 140 09/26/2018   K 4.7 09/26/2018   CL 103 09/26/2018   CREATININE 0.97 09/26/2018   BUN 16 09/26/2018   CO2 30 09/26/2018   TSH 3.50 09/26/2018   PSA 0.45 09/26/2018    CT Renal Stone Study  Result Date: 11/09/2016 CLINICAL DATA:  Left flank pain. EXAM: CT ABDOMEN AND PELVIS WITHOUT CONTRAST TECHNIQUE: Multidetector CT imaging of the abdomen and pelvis was performed following the standard protocol without IV contrast. COMPARISON:  CT 08/03/2014 FINDINGS: Lower chest: The lung bases are clear. Hepatobiliary: Mild hepatic steatosis, no evidence of focal lesion. Gallbladder physiologically distended, no calcified stone. No biliary dilatation. Pancreas: No ductal dilatation or inflammation. Spleen: Normal in size without focal abnormality. Adrenals/Urinary Tract: Normal adrenal glands. Obstructing 4 x 4 mm stone in the distal left ureter just proximal to the ureterovesicular junction with moderate left hydroureteronephrosis. Moderate left perinephric edema. Multiple, at least 7, nonobstructing stones in the left kidney. No right hydronephrosis or hydroureter. Multiple, at least 8, nonobstructing stones in the right kidney. Urinary bladder is physiologically distended without stone or wall thickening. Stomach/Bowel: Diverticulosis throughout the entire colon without acute diverticulitis. Appendix not confidently visualized. No small bowel dilatation or inflammation. Stomach is nondistended. Vascular/Lymphatic: Mild aortic and branch atherosclerosis without aneurysm. No abdominal or pelvic adenopathy. Reproductive: Prostate is unremarkable. Other: No free air, free fluid, or intra-abdominal fluid collection. Tiny fat containing umbilical hernia. Musculoskeletal: There are no acute or suspicious osseous abnormalities. IMPRESSION: 1. Obstructing 4 mm stone in the distal left ureter just proximal to the ureterovesicular junction with  moderate hydronephrosis and perinephric edema. 2. Multiple additional nonobstructing bilateral renal calculi. 3. Multifocal colonic diverticulosis throughout the entire colon. No acute diverticulitis. 4. Hepatic steatosis. 5.  Aortic Atherosclerosis (ICD10-I70.0). Electronically Signed   By: Jeb Levering M.D.   On: 11/09/2016 05:42    Assessment & Plan:   Walker Kehr, MD

## 2019-10-01 ENCOUNTER — Encounter: Payer: Self-pay | Admitting: Gastroenterology

## 2019-10-01 LAB — HEPATIC FUNCTION PANEL
AG Ratio: 1.4 (calc) (ref 1.0–2.5)
ALT: 16 U/L (ref 9–46)
AST: 17 U/L (ref 10–35)
Albumin: 4.2 g/dL (ref 3.6–5.1)
Alkaline phosphatase (APISO): 57 U/L (ref 35–144)
Bilirubin, Direct: 0.1 mg/dL (ref 0.0–0.2)
Globulin: 3.1 g/dL (calc) (ref 1.9–3.7)
Indirect Bilirubin: 0.3 mg/dL (calc) (ref 0.2–1.2)
Total Bilirubin: 0.4 mg/dL (ref 0.2–1.2)
Total Protein: 7.3 g/dL (ref 6.1–8.1)

## 2019-10-01 LAB — CBC WITH DIFFERENTIAL/PLATELET
Absolute Monocytes: 431 cells/uL (ref 200–950)
Basophils Absolute: 29 cells/uL (ref 0–200)
Basophils Relative: 0.6 %
Eosinophils Absolute: 451 cells/uL (ref 15–500)
Eosinophils Relative: 9.2 %
HCT: 42.1 % (ref 38.5–50.0)
Hemoglobin: 13.7 g/dL (ref 13.2–17.1)
Lymphs Abs: 1401 cells/uL (ref 850–3900)
MCH: 28.6 pg (ref 27.0–33.0)
MCHC: 32.5 g/dL (ref 32.0–36.0)
MCV: 87.9 fL (ref 80.0–100.0)
MPV: 9.7 fL (ref 7.5–12.5)
Monocytes Relative: 8.8 %
Neutro Abs: 2587 cells/uL (ref 1500–7800)
Neutrophils Relative %: 52.8 %
Platelets: 190 10*3/uL (ref 140–400)
RBC: 4.79 10*6/uL (ref 4.20–5.80)
RDW: 12.5 % (ref 11.0–15.0)
Total Lymphocyte: 28.6 %
WBC: 4.9 10*3/uL (ref 3.8–10.8)

## 2019-10-01 LAB — URINALYSIS
Bilirubin Urine: NEGATIVE
Glucose, UA: NEGATIVE
Hgb urine dipstick: NEGATIVE
Ketones, ur: NEGATIVE
Leukocytes,Ua: NEGATIVE
Nitrite: NEGATIVE
Protein, ur: NEGATIVE
Specific Gravity, Urine: 1.02 (ref 1.001–1.03)
pH: 6 (ref 5.0–8.0)

## 2019-10-01 LAB — LIPID PANEL
Cholesterol: 175 mg/dL (ref <200)
HDL: 34 mg/dL — ABNORMAL LOW (ref >=40)
LDL Cholesterol (Calc): 104 mg/dL (calc) — ABNORMAL HIGH
Non-HDL Cholesterol (Calc): 141 mg/dL (calc) — ABNORMAL HIGH (ref <130)
Total CHOL/HDL Ratio: 5.1 (calc) — ABNORMAL HIGH (ref <5.0)
Triglycerides: 247 mg/dL — ABNORMAL HIGH (ref <150)

## 2019-10-01 LAB — TSH: TSH: 1.72 mIU/L (ref 0.40–4.50)

## 2019-10-01 LAB — PSA: PSA: 0.4 ng/mL (ref <=4.0)

## 2019-11-19 DIAGNOSIS — R69 Illness, unspecified: Secondary | ICD-10-CM | POA: Diagnosis not present

## 2019-11-20 ENCOUNTER — Ambulatory Visit (AMBULATORY_SURGERY_CENTER): Payer: Self-pay | Admitting: *Deleted

## 2019-11-20 ENCOUNTER — Other Ambulatory Visit: Payer: Self-pay

## 2019-11-20 ENCOUNTER — Encounter: Payer: Self-pay | Admitting: Gastroenterology

## 2019-11-20 VITALS — Ht 69.0 in | Wt 197.2 lb

## 2019-11-20 DIAGNOSIS — Z86010 Personal history of colonic polyps: Secondary | ICD-10-CM

## 2019-11-20 NOTE — Progress Notes (Signed)

## 2019-12-04 ENCOUNTER — Encounter: Payer: Medicare HMO | Admitting: Gastroenterology

## 2019-12-10 ENCOUNTER — Encounter: Payer: Self-pay | Admitting: Certified Registered Nurse Anesthetist

## 2019-12-11 ENCOUNTER — Encounter: Payer: Self-pay | Admitting: Gastroenterology

## 2019-12-11 ENCOUNTER — Ambulatory Visit (AMBULATORY_SURGERY_CENTER): Payer: Medicare HMO | Admitting: Gastroenterology

## 2019-12-11 ENCOUNTER — Other Ambulatory Visit: Payer: Self-pay

## 2019-12-11 VITALS — BP 121/74 | HR 49 | Temp 97.5°F | Resp 17 | Ht 69.0 in | Wt 197.0 lb

## 2019-12-11 DIAGNOSIS — G4733 Obstructive sleep apnea (adult) (pediatric): Secondary | ICD-10-CM | POA: Diagnosis not present

## 2019-12-11 DIAGNOSIS — I10 Essential (primary) hypertension: Secondary | ICD-10-CM | POA: Diagnosis not present

## 2019-12-11 DIAGNOSIS — Z86010 Personal history of colonic polyps: Secondary | ICD-10-CM

## 2019-12-11 MED ORDER — SODIUM CHLORIDE 0.9 % IV SOLN: 500.0000 mL | Freq: Once | INTRAVENOUS | Status: DC

## 2019-12-11 NOTE — Progress Notes (Signed)
Report given to PACU, vss 

## 2019-12-11 NOTE — Op Note (Signed)
[place] Patient Name: Shane Short Procedure Date: 12/11/2019 10:36 AM MRN: [id number] Endoscopist: Milus Banister , MD Age: 69 Referring MD:  Date of Birth: 02/29/1951 Gender: Male Account #: [id number] Procedure:                Colonoscopy Indications:              High risk colon cancer surveillance: Personal                            history of colonic polyps; Colonoscopy 2014 Dr.                            Sharlett Iles one subCM adenoma removed Medicines:                Monitored Anesthesia Care Procedure:                Pre-Anesthesia Assessment:                           - Prior to the procedure, a History and Physical                            was performed, and patient medications and                            allergies were reviewed. The patient's tolerance of                            previous anesthesia was also reviewed. The risks                            and benefits of the procedure and the sedation                            options and risks were discussed with the patient.                            All questions were answered, and informed consent                            was obtained. Prior Anticoagulants: The patient has                            taken no previous anticoagulant or antiplatelet                            agents. ASA Grade Assessment: II - A patient with                            mild systemic disease. After reviewing the risks                            and benefits, the patient was deemed in  satisfactory condition to undergo the procedure.                           After obtaining informed consent, the colonoscope                            was passed under direct vision. Throughout the                            procedure, the patient's blood pressure, pulse, and                            oxygen saturations were monitored continuously. The                            Colonoscope was introduced  through the anus and                            advanced to the the cecum, identified by                            appendiceal orifice and ileocecal valve. The                            colonoscopy was performed without difficulty. The                            patient tolerated the procedure well. The quality                            of the bowel preparation was good. The ileocecal                            valve, appendiceal orifice, and rectum were                            photographed. Scope In: 10:40:29 AM Scope Out: 10:51:32 AM Scope Withdrawal Time: 0 hours 8 minutes 11 seconds  Total Procedure Duration: 0 hours 11 minutes 3 seconds  Findings:                 Multiple small and large-mouthed diverticula were                            found in the left colon.                           External and internal hemorrhoids were found. The                            hemorrhoids were small.                           The exam was otherwise without abnormality on  direct and retroflexion views. Complications:            No immediate complications. Estimated blood loss:                            None. Estimated Blood Loss:     Estimated blood loss: none. Impression:               - Diverticulosis in the left colon.                           - External and internal hemorrhoids.                           - The examination was otherwise normal on direct                            and retroflexion views.                           - No polyps or cancers. Recommendation:           - Patient has a contact number available for                            emergencies. The signs and symptoms of potential                            delayed complications were discussed with the                            patient. Return to normal activities tomorrow.                            Written discharge instructions were provided to the                            patient.                            - Resume previous diet.                           - Continue present medications.                           - Repeat colonoscopy in 10 years for screening. Milus Banister, MD 12/11/2019 10:54:19 AM This report has been signed electronically.

## 2019-12-11 NOTE — Patient Instructions (Signed)
Handouts provided on diverticulosis and hemorrhoids.   YOU HAD AN ENDOSCOPIC PROCEDURE TODAY AT THE [facility]:   Refer to the procedure report that was given to you for any specific questions about what was found during the examination.  If the procedure report does not answer your questions, please call your gastroenterologist to clarify.  If you requested that your care partner not be given the details of your procedure findings, then the procedure report has been included in a sealed envelope for you to review at your convenience later.  YOU SHOULD EXPECT: Some feelings of bloating in the abdomen. Passage of more gas than usual.  Walking can help get rid of the air that was put into your GI tract during the procedure and reduce the bloating. If you had a lower endoscopy (such as a colonoscopy or flexible sigmoidoscopy) you may notice spotting of blood in your stool or on the toilet paper. If you underwent a bowel prep for your procedure, you may not have a normal bowel movement for a few days.  Please Note:  You might notice some irritation and congestion in your nose or some drainage.  This is from the oxygen used during your procedure.  There is no need for concern and it should clear up in a day or so.  SYMPTOMS TO REPORT IMMEDIATELY:   Following lower endoscopy (colonoscopy or flexible sigmoidoscopy):  Excessive amounts of blood in the stool  Significant tenderness or worsening of abdominal pains  Swelling of the abdomen that is new, acute  Fever of 100F or higher   For urgent or emergent issues, a gastroenterologist can be reached at any hour by calling [phone]. Do not use MyChart messaging for urgent concerns.    DIET:  We do recommend a small meal at first, but then you may proceed to your regular diet.  Drink plenty of fluids but you should avoid alcoholic beverages for 24 hours.  ACTIVITY:  You should plan to take it easy for the rest of today and you should  NOT DRIVE or use heavy machinery until tomorrow (because of the sedation medicines used during the test).    FOLLOW UP: Our staff will call the number listed on your records 48-72 hours following your procedure to check on you and address any questions or concerns that you may have regarding the information given to you following your procedure. If we do not reach you, we will leave a message.  We will attempt to reach you two times.  During this call, we will ask if you have developed any symptoms of COVID 19. If you develop any symptoms (ie: fever, flu-like symptoms, shortness of breath, cough etc.) before then, please call [phone].  If you test positive for Covid 19 in the 2 weeks post procedure, please call and report this information to us.    If any biopsies were taken you will be contacted by phone or by letter within the next 1-3 weeks.  Please call us at [phone] if you have not heard about the biopsies in 3 weeks.    SIGNATURES/CONFIDENTIALITY: You and/or your care partner have signed paperwork which will be entered into your electronic medical record.  These signatures attest to the fact that that the information above on your After Visit Summary has been reviewed and is understood.  Full responsibility of the confidentiality of this discharge information lies with you and/or your care-partner.  

## 2019-12-11 NOTE — Progress Notes (Signed)
Vs by CW  No changes in medical or social hx since previsit.

## 2019-12-12 DIAGNOSIS — L814 Other melanin hyperpigmentation: Secondary | ICD-10-CM | POA: Diagnosis not present

## 2019-12-12 DIAGNOSIS — L578 Other skin changes due to chronic exposure to nonionizing radiation: Secondary | ICD-10-CM | POA: Diagnosis not present

## 2019-12-12 DIAGNOSIS — Z85828 Personal history of other malignant neoplasm of skin: Secondary | ICD-10-CM | POA: Diagnosis not present

## 2019-12-12 DIAGNOSIS — D225 Melanocytic nevi of trunk: Secondary | ICD-10-CM | POA: Diagnosis not present

## 2019-12-12 DIAGNOSIS — L821 Other seborrheic keratosis: Secondary | ICD-10-CM | POA: Diagnosis not present

## 2019-12-12 DIAGNOSIS — L905 Scar conditions and fibrosis of skin: Secondary | ICD-10-CM | POA: Diagnosis not present

## 2019-12-12 DIAGNOSIS — L819 Disorder of pigmentation, unspecified: Secondary | ICD-10-CM | POA: Diagnosis not present

## 2019-12-12 DIAGNOSIS — L570 Actinic keratosis: Secondary | ICD-10-CM | POA: Diagnosis not present

## 2019-12-12 DIAGNOSIS — D1801 Hemangioma of skin and subcutaneous tissue: Secondary | ICD-10-CM | POA: Diagnosis not present

## 2019-12-13 ENCOUNTER — Telehealth: Payer: Self-pay

## 2019-12-13 NOTE — Telephone Encounter (Signed)
  Follow up Call-  Call back number 12/11/2019  Post procedure Call Back phone  # [phone]  Permission to leave phone message Yes  Some recent data might be hidden     Patient questions:  Do you have a fever, pain , or abdominal swelling? No. Pain Score  0 *  Have you tolerated food without any problems? Yes.    Have you been able to return to your normal activities? Yes.    Do you have any questions about your discharge instructions: Diet   No. Medications  No. Follow up visit  No.  Do you have questions or concerns about your Care? No.  Actions: * If pain score is 4 or above: No action needed, pain <4.  1. Have you developed a fever since your procedure? no  2.   Have you had an respiratory symptoms (SOB or cough) since your procedure? no  3.   Have you tested positive for COVID 19 since your procedure no  4.   Have you had any family members/close contacts diagnosed with the COVID 19 since your procedure?  no   If yes to any of these questions please route to Joylene John, RN and Joella Prince, RN

## 2019-12-16 ENCOUNTER — Other Ambulatory Visit: Payer: Self-pay | Admitting: Internal Medicine

## 2020-01-28 DIAGNOSIS — L249 Irritant contact dermatitis, unspecified cause: Secondary | ICD-10-CM | POA: Diagnosis not present

## 2020-01-28 DIAGNOSIS — L570 Actinic keratosis: Secondary | ICD-10-CM | POA: Diagnosis not present

## 2020-03-31 DIAGNOSIS — H524 Presbyopia: Secondary | ICD-10-CM | POA: Diagnosis not present

## 2020-04-14 DIAGNOSIS — L810 Postinflammatory hyperpigmentation: Secondary | ICD-10-CM | POA: Diagnosis not present

## 2020-04-14 DIAGNOSIS — C44311 Basal cell carcinoma of skin of nose: Secondary | ICD-10-CM | POA: Diagnosis not present

## 2020-04-14 DIAGNOSIS — B351 Tinea unguium: Secondary | ICD-10-CM | POA: Diagnosis not present

## 2020-04-14 DIAGNOSIS — D485 Neoplasm of uncertain behavior of skin: Secondary | ICD-10-CM | POA: Diagnosis not present

## 2020-05-19 DIAGNOSIS — C44311 Basal cell carcinoma of skin of nose: Secondary | ICD-10-CM | POA: Diagnosis not present

## 2020-08-13 DIAGNOSIS — H52223 Regular astigmatism, bilateral: Secondary | ICD-10-CM | POA: Diagnosis not present

## 2020-08-13 DIAGNOSIS — H524 Presbyopia: Secondary | ICD-10-CM | POA: Diagnosis not present

## 2020-09-13 ENCOUNTER — Other Ambulatory Visit: Payer: Self-pay | Admitting: Internal Medicine

## 2020-09-15 ENCOUNTER — Other Ambulatory Visit: Payer: Self-pay | Admitting: Internal Medicine

## 2020-09-30 ENCOUNTER — Ambulatory Visit (INDEPENDENT_AMBULATORY_CARE_PROVIDER_SITE_OTHER): Payer: Medicare HMO | Admitting: Internal Medicine

## 2020-09-30 ENCOUNTER — Encounter: Payer: Self-pay | Admitting: Internal Medicine

## 2020-09-30 ENCOUNTER — Other Ambulatory Visit: Payer: Self-pay

## 2020-09-30 VITALS — BP 120/68 | HR 49 | Temp 98.4°F | Ht 69.0 in | Wt 191.8 lb

## 2020-09-30 DIAGNOSIS — E785 Hyperlipidemia, unspecified: Secondary | ICD-10-CM | POA: Diagnosis not present

## 2020-09-30 DIAGNOSIS — E538 Deficiency of other specified B group vitamins: Secondary | ICD-10-CM

## 2020-09-30 DIAGNOSIS — R001 Bradycardia, unspecified: Secondary | ICD-10-CM | POA: Diagnosis not present

## 2020-09-30 DIAGNOSIS — I10 Essential (primary) hypertension: Secondary | ICD-10-CM | POA: Diagnosis not present

## 2020-09-30 DIAGNOSIS — I700 Atherosclerosis of aorta: Secondary | ICD-10-CM | POA: Diagnosis not present

## 2020-09-30 DIAGNOSIS — E034 Atrophy of thyroid (acquired): Secondary | ICD-10-CM | POA: Diagnosis not present

## 2020-09-30 DIAGNOSIS — Z0000 Encounter for general adult medical examination without abnormal findings: Secondary | ICD-10-CM | POA: Diagnosis not present

## 2020-09-30 LAB — URINALYSIS
Bilirubin Urine: NEGATIVE
Hgb urine dipstick: NEGATIVE
Ketones, ur: NEGATIVE
Leukocytes,Ua: NEGATIVE
Nitrite: NEGATIVE
Specific Gravity, Urine: 1.02 (ref 1.000–1.030)
Total Protein, Urine: NEGATIVE
Urine Glucose: NEGATIVE
Urobilinogen, UA: 0.2 (ref 0.0–1.0)
pH: 6 (ref 5.0–8.0)

## 2020-09-30 LAB — TSH: TSH: 2.38 u[IU]/mL (ref 0.35–5.50)

## 2020-09-30 LAB — CBC WITH DIFFERENTIAL/PLATELET
Basophils Absolute: 0 10*3/uL (ref 0.0–0.1)
Basophils Relative: 0.7 % (ref 0.0–3.0)
Eosinophils Absolute: 0.4 10*3/uL (ref 0.0–0.7)
Eosinophils Relative: 7.8 % — ABNORMAL HIGH (ref 0.0–5.0)
HCT: 40.4 % (ref 39.0–52.0)
Hemoglobin: 13.9 g/dL (ref 13.0–17.0)
Lymphocytes Relative: 36.1 % (ref 12.0–46.0)
Lymphs Abs: 1.8 10*3/uL (ref 0.7–4.0)
MCHC: 34.5 g/dL (ref 30.0–36.0)
MCV: 86.1 fl (ref 78.0–100.0)
Monocytes Absolute: 0.5 10*3/uL (ref 0.1–1.0)
Monocytes Relative: 9.5 % (ref 3.0–12.0)
Neutro Abs: 2.3 10*3/uL (ref 1.4–7.7)
Neutrophils Relative %: 45.9 % (ref 43.0–77.0)
Platelets: 192 10*3/uL (ref 150.0–400.0)
RBC: 4.69 Mil/uL (ref 4.22–5.81)
RDW: 12.9 % (ref 11.5–15.5)
WBC: 5.1 10*3/uL (ref 4.0–10.5)

## 2020-09-30 LAB — COMPREHENSIVE METABOLIC PANEL WITH GFR
ALT: 20 U/L (ref 0–53)
AST: 23 U/L (ref 0–37)
Albumin: 4.2 g/dL (ref 3.5–5.2)
Alkaline Phosphatase: 53 U/L (ref 39–117)
BUN: 18 mg/dL (ref 6–23)
CO2: 28 meq/L (ref 19–32)
Calcium: 9.2 mg/dL (ref 8.4–10.5)
Chloride: 106 meq/L (ref 96–112)
Creatinine, Ser: 0.93 mg/dL (ref 0.40–1.50)
GFR: 84.03 mL/min
Glucose, Bld: 94 mg/dL (ref 70–99)
Potassium: 4.3 meq/L (ref 3.5–5.1)
Sodium: 140 meq/L (ref 135–145)
Total Bilirubin: 0.5 mg/dL (ref 0.2–1.2)
Total Protein: 7.2 g/dL (ref 6.0–8.3)

## 2020-09-30 LAB — LIPID PANEL
Cholesterol: 192 mg/dL (ref 0–200)
HDL: 38.7 mg/dL — ABNORMAL LOW
LDL Cholesterol: 126 mg/dL — ABNORMAL HIGH (ref 0–99)
NonHDL: 153.38
Total CHOL/HDL Ratio: 5
Triglycerides: 137 mg/dL (ref 0.0–149.0)
VLDL: 27.4 mg/dL (ref 0.0–40.0)

## 2020-09-30 LAB — PSA: PSA: 0.54 ng/mL (ref 0.10–4.00)

## 2020-09-30 MED ORDER — LEVOTHYROXINE SODIUM 125 MCG PO TABS: 125.0000 ug | ORAL_TABLET | Freq: Every day | ORAL | 3 refills | Status: DC

## 2020-09-30 NOTE — Progress Notes (Signed)
Subjective:  Patient ID: Shane Short, male    DOB: 1950/07/08  Age: 70 y.o. MRN: [id number]  CC: Annual Exam   HPI Shane Short presents for a well exam   Outpatient Medications Prior to Visit  Medication Sig Dispense Refill   aspirin 81 MG EC tablet Take 81 mg by mouth daily after breakfast.       Cholecalciferol (VITAMIN D3) 1000 UNITS tablet Take 1,000 Units by mouth daily.       levothyroxine (EUTHYROX) 125 MCG tablet Take 1 tablet (125 mcg total) by mouth daily before breakfast. Annual appt due in August w/labs must see provider for future refills 30 tablet 0   metoprolol succinate (TOPROL-XL) 25 MG 24 hr tablet Take 1 tablet by mouth once daily Annual appt due in August must see provider for future refills 90 tablet 0   saw palmetto 160 MG capsule 1 po qd 100 capsule 3   cetirizine (ZYRTEC) 10 MG tablet Take 10 mg by mouth daily.     No facility-administered medications prior to visit.    ROS: Review of Systems  Constitutional:  Negative for appetite change, fatigue and unexpected weight change.  HENT:  Negative for congestion, nosebleeds, sneezing, sore throat and trouble swallowing.   Eyes:  Negative for itching and visual disturbance.  Respiratory:  Negative for cough.   Cardiovascular:  Negative for chest pain, palpitations and leg swelling.  Gastrointestinal:  Negative for abdominal distention, blood in stool, diarrhea and nausea.  Genitourinary:  Negative for frequency and hematuria.  Musculoskeletal:  Negative for back pain, gait problem, joint swelling and neck pain.  Skin:  Negative for rash.  Neurological:  Negative for dizziness, tremors, speech difficulty and weakness.  Psychiatric/Behavioral:  Negative for agitation, dysphoric mood and sleep disturbance. The patient is not nervous/anxious.    Objective:  BP 120/68 (BP Location: Left Arm)   Pulse (!) 49   Temp 98.4 F (36.9 C) (Oral)   Ht '5\' 9"'$  (1.753 m)   Wt 191 lb 12.8 oz (87 kg)   SpO2 97%    BMI 28.32 kg/m   BP Readings from Last 3 Encounters:  09/30/20 120/68  12/11/19 121/74  09/30/19 (!) 130/76    Wt Readings from Last 3 Encounters:  09/30/20 191 lb 12.8 oz (87 kg)  12/11/19 197 lb (89.4 kg)  11/20/19 197 lb 3.2 oz (89.4 kg)    Physical Exam Constitutional:      General: He is not in acute distress.    Appearance: He is well-developed. He is obese.     Comments: NAD  Eyes:     Conjunctiva/sclera: Conjunctivae normal.     Pupils: Pupils are equal, round, and reactive to light.  Neck:     Thyroid: No thyromegaly.     Vascular: No JVD.  Cardiovascular:     Rate and Rhythm: Regular rhythm. Bradycardia present.     Heart sounds: Normal heart sounds. No murmur heard.   No friction rub. No gallop.  Pulmonary:     Effort: Pulmonary effort is normal. No respiratory distress.     Breath sounds: Normal breath sounds. No wheezing or rales.  Chest:     Chest wall: No tenderness.  Abdominal:     General: Bowel sounds are normal. There is no distension.     Palpations: Abdomen is soft. There is no mass.     Tenderness: There is no abdominal tenderness. There is no guarding or rebound.  Musculoskeletal:  General: No tenderness. Normal range of motion.     Cervical back: Normal range of motion.  Lymphadenopathy:     Cervical: No cervical adenopathy.  Skin:    General: Skin is warm and dry.     Findings: No rash.  Neurological:     Mental Status: He is alert and oriented to person, place, and time.     Cranial Nerves: No cranial nerve deficit.     Motor: No abnormal muscle tone.     Coordination: Coordination normal.     Gait: Gait normal.     Deep Tendon Reflexes: Reflexes are normal and symmetric.  Psychiatric:        Behavior: Behavior normal.        Thought Content: Thought content normal.        Judgment: Judgment normal.    Lab Results  Component Value Date   WBC 4.9 09/30/2019   HGB 13.7 09/30/2019   HCT 42.1 09/30/2019   PLT 190 09/30/2019    GLUCOSE 101 (H) 09/30/2019   CHOL 175 09/30/2019   TRIG 247 (H) 09/30/2019   HDL 34 (L) 09/30/2019   LDLDIRECT 98.0 09/26/2018   LDLCALC 104 (H) 09/30/2019   ALT 16 09/30/2019   ALT 16 09/30/2019   AST 18 09/30/2019   AST 17 09/30/2019   NA 138 09/30/2019   K 4.2 09/30/2019   CL 105 09/30/2019   CREATININE 1.04 09/30/2019   BUN 13 09/30/2019   CO2 29 09/30/2019   TSH 1.72 09/30/2019   PSA 0.4 09/30/2019    CT Renal Stone Study  Result Date: 11/09/2016 CLINICAL DATA:  Left flank pain. EXAM: CT ABDOMEN AND PELVIS WITHOUT CONTRAST TECHNIQUE: Multidetector CT imaging of the abdomen and pelvis was performed following the standard protocol without IV contrast. COMPARISON:  CT 08/03/2014 FINDINGS: Lower chest: The lung bases are clear. Hepatobiliary: Mild hepatic steatosis, no evidence of focal lesion. Gallbladder physiologically distended, no calcified stone. No biliary dilatation. Pancreas: No ductal dilatation or inflammation. Spleen: Normal in size without focal abnormality. Adrenals/Urinary Tract: Normal adrenal glands. Obstructing 4 x 4 mm stone in the distal left ureter just proximal to the ureterovesicular junction with moderate left hydroureteronephrosis. Moderate left perinephric edema. Multiple, at least 7, nonobstructing stones in the left kidney. No right hydronephrosis or hydroureter. Multiple, at least 8, nonobstructing stones in the right kidney. Urinary bladder is physiologically distended without stone or wall thickening. Stomach/Bowel: Diverticulosis throughout the entire colon without acute diverticulitis. Appendix not confidently visualized. No small bowel dilatation or inflammation. Stomach is nondistended. Vascular/Lymphatic: Mild aortic and branch atherosclerosis without aneurysm. No abdominal or pelvic adenopathy. Reproductive: Prostate is unremarkable. Other: No free air, free fluid, or intra-abdominal fluid collection. Tiny fat containing umbilical hernia.  Musculoskeletal: There are no acute or suspicious osseous abnormalities. IMPRESSION: 1. Obstructing 4 mm stone in the distal left ureter just proximal to the ureterovesicular junction with moderate hydronephrosis and perinephric edema. 2. Multiple additional nonobstructing bilateral renal calculi. 3. Multifocal colonic diverticulosis throughout the entire colon. No acute diverticulitis. 4. Hepatic steatosis. 5.  Aortic Atherosclerosis (ICD10-I70.0). Electronically Signed   By: Jeb Levering M.D.   On: 11/09/2016 05:42    Assessment & Plan:     Walker Kehr, MD

## 2020-09-30 NOTE — Assessment & Plan Note (Signed)
On B12 

## 2020-09-30 NOTE — Assessment & Plan Note (Signed)
We discussed age appropriate health related issues, including available/recomended screening tests and vaccinations. Labs were ordered to be later reviewed . All questions were answered. We discussed one or more of the following - seat belt use, use of sunscreen/sun exposure exercise, fall risk reduction, second hand smoke exposure, firearm use and storage, seat belt use, a need for adhering to healthy diet and exercise. Labs were ordered.  All questions were answered. A cardiac CT scan for coronary calcium offered 7/20, 7/21 Colon 2011

## 2020-09-30 NOTE — Assessment & Plan Note (Signed)
Due to Metoprolol, asymptomatic HR 49-54s

## 2020-09-30 NOTE — Assessment & Plan Note (Signed)
On Toprol XL

## 2020-09-30 NOTE — Assessment & Plan Note (Addendum)
A cardiac CT scan for coronary calcium offered - pt declined

## 2020-09-30 NOTE — Assessment & Plan Note (Signed)
On Levothroid 

## 2020-09-30 NOTE — Addendum Note (Signed)
Addended by: Boris Lown B on: 09/30/2020 08:30 AM   Modules accepted: Orders

## 2020-09-30 NOTE — Assessment & Plan Note (Signed)
CT 2018 On diet, exercise

## 2020-10-05 ENCOUNTER — Ambulatory Visit: Payer: Medicare HMO

## 2020-10-12 DIAGNOSIS — L570 Actinic keratosis: Secondary | ICD-10-CM | POA: Diagnosis not present

## 2020-10-12 DIAGNOSIS — L814 Other melanin hyperpigmentation: Secondary | ICD-10-CM | POA: Diagnosis not present

## 2020-10-12 DIAGNOSIS — Z85828 Personal history of other malignant neoplasm of skin: Secondary | ICD-10-CM | POA: Diagnosis not present

## 2020-10-12 DIAGNOSIS — L905 Scar conditions and fibrosis of skin: Secondary | ICD-10-CM | POA: Diagnosis not present

## 2020-10-12 DIAGNOSIS — D225 Melanocytic nevi of trunk: Secondary | ICD-10-CM | POA: Diagnosis not present

## 2020-10-12 DIAGNOSIS — D1801 Hemangioma of skin and subcutaneous tissue: Secondary | ICD-10-CM | POA: Diagnosis not present

## 2020-10-12 DIAGNOSIS — L821 Other seborrheic keratosis: Secondary | ICD-10-CM | POA: Diagnosis not present

## 2020-10-12 DIAGNOSIS — B351 Tinea unguium: Secondary | ICD-10-CM | POA: Diagnosis not present

## 2020-10-19 ENCOUNTER — Ambulatory Visit (INDEPENDENT_AMBULATORY_CARE_PROVIDER_SITE_OTHER): Payer: Medicare HMO

## 2020-10-19 DIAGNOSIS — Z0000 Encounter for general adult medical examination without abnormal findings: Secondary | ICD-10-CM | POA: Diagnosis not present

## 2020-10-19 NOTE — Patient Instructions (Addendum)
Shane Short , Thank you for taking time to come for your Medicare Wellness Visit. I appreciate your ongoing commitment to your health goals. Please review the following plan we discussed and let me know if I can assist you in the future.   Screening recommendations/referrals: Colonoscopy: 12/11/2019; due every 10 years Recommended yearly ophthalmology/optometry visit for glaucoma screening and checkup Recommended yearly dental visit for hygiene and checkup  Vaccinations: Influenza vaccine: due Fall 2022 Pneumococcal vaccine: 09/08/2014, 09/26/2018, 11/19/2019 Tdap vaccine: 6/292012; due every 10 years (Overdue) Shingles vaccine: 11/19/2019, 01/31/2020   Covid-19: 04/06/2019, 04/30/2019, 12/19/2019, 06/19/2020  Advanced directives: Advance directive discussed with you today. Even though you declined this today please call our office should you change your mind and we can give you the proper paperwork for you to fill out.  Conditions/risks identified: Yes; Client understands the importance of follow-up with providers by attending scheduled visits and discussed goals to eat healthier, increase physical activity, exercise the brain, socialize more, get enough sleep and make time for laughter.  Next appointment: 10/20/2021 at 9:50 am phone visit   Preventive Care 65 Years and Older, Male Preventive care refers to lifestyle choices and visits with your health care provider that can promote health and wellness. What does preventive care include? A yearly physical exam. This is also called an annual well check. Dental exams once or twice a year. Routine eye exams. Ask your health care provider how often you should have your eyes checked. Personal lifestyle choices, including: Daily care of your teeth and gums. Regular physical activity. Eating a healthy diet. Avoiding tobacco and drug use. Limiting alcohol use. Practicing safe sex. Taking low doses of aspirin every day. Taking vitamin and mineral  supplements as recommended by your health care provider. What happens during an annual well check? The services and screenings done by your health care provider during your annual well check will depend on your age, overall health, lifestyle risk factors, and family history of disease. Counseling  Your health care provider may ask you questions about your: Alcohol use. Tobacco use. Drug use. Emotional well-being. Home and relationship well-being. Sexual activity. Eating habits. History of falls. Memory and ability to understand (cognition). Work and work Statistician. Screening  You may have the following tests or measurements: Height, weight, and BMI. Blood pressure. Lipid and cholesterol levels. These may be checked every 5 years, or more frequently if you are over 62 years old. Skin check. Lung cancer screening. You may have this screening every year starting at age 41 if you have a 30-pack-year history of smoking and currently smoke or have quit within the past 15 years. Fecal occult blood test (FOBT) of the stool. You may have this test every year starting at age 69. Flexible sigmoidoscopy or colonoscopy. You may have a sigmoidoscopy every 5 years or a colonoscopy every 10 years starting at age 71. Prostate cancer screening. Recommendations will vary depending on your family history and other risks. Hepatitis C blood test. Hepatitis B blood test. Sexually transmitted disease (STD) testing. Diabetes screening. This is done by checking your blood sugar (glucose) after you have not eaten for a while (fasting). You may have this done every 1-3 years. Abdominal aortic aneurysm (AAA) screening. You may need this if you are a current or former smoker. Osteoporosis. You may be screened starting at age 56 if you are at high risk. Talk with your health care provider about your test results, treatment options, and if necessary, the need for more tests.  Vaccines  Your health care provider  may recommend certain vaccines, such as: Influenza vaccine. This is recommended every year. Tetanus, diphtheria, and acellular pertussis (Tdap, Td) vaccine. You may need a Td booster every 10 years. Zoster vaccine. You may need this after age 44. Pneumococcal 13-valent conjugate (PCV13) vaccine. One dose is recommended after age 28. Pneumococcal polysaccharide (PPSV23) vaccine. One dose is recommended after age 44. Talk to your health care provider about which screenings and vaccines you need and how often you need them. This information is not intended to replace advice given to you by your health care provider. Make sure you discuss any questions you have with your health care provider. Document Released: 03/13/2015 Document Revised: 11/04/2015 Document Reviewed: 12/16/2014 Elsevier Interactive Patient Education  2017 [employer] Prevention in the Home Falls can cause injuries. They can happen to people of all ages. There are many things you can do to make your home safe and to help prevent falls. What can I do on the outside of my home? Regularly fix the edges of walkways and driveways and fix any cracks. Remove anything that might make you trip as you walk through a door, such as a raised step or threshold. Trim any bushes or trees on the path to your home. Use bright outdoor lighting. Clear any walking paths of anything that might make someone trip, such as rocks or tools. Regularly check to see if handrails are loose or broken. Make sure that both sides of any steps have handrails. Any raised decks and porches should have guardrails on the edges. Have any leaves, snow, or ice cleared regularly. Use sand or salt on walking paths during winter. Clean up any spills in your garage right away. This includes oil or grease spills. What can I do in the bathroom? Use night lights. Install grab bars by the toilet and in the tub and shower. Do not use towel bars as grab bars. Use  non-skid mats or decals in the tub or shower. If you need to sit down in the shower, use a plastic, non-slip stool. Keep the floor dry. Clean up any water that spills on the floor as soon as it happens. Remove soap buildup in the tub or shower regularly. Attach bath mats securely with double-sided non-slip rug tape. Do not have throw rugs and other things on the floor that can make you trip. What can I do in the bedroom? Use night lights. Make sure that you have a light by your bed that is easy to reach. Do not use any sheets or blankets that are too big for your bed. They should not hang down onto the floor. Have a firm chair that has side arms. You can use this for support while you get dressed. Do not have throw rugs and other things on the floor that can make you trip. What can I do in the kitchen? Clean up any spills right away. Avoid walking on wet floors. Keep items that you use a lot in easy-to-reach places. If you need to reach something above you, use a strong step stool that has a grab bar. Keep electrical cords out of the way. Do not use floor polish or wax that makes floors slippery. If you must use wax, use non-skid floor wax. Do not have throw rugs and other things on the floor that can make you trip. What can I do with my stairs? Do not leave any items on the stairs. Make sure that  there are handrails on both sides of the stairs and use them. Fix handrails that are broken or loose. Make sure that handrails are as long as the stairways. Check any carpeting to make sure that it is firmly attached to the stairs. Fix any carpet that is loose or worn. Avoid having throw rugs at the top or bottom of the stairs. If you do have throw rugs, attach them to the floor with carpet tape. Make sure that you have a light switch at the top of the stairs and the bottom of the stairs. If you do not have them, ask someone to add them for you. What else can I do to help prevent falls? Wear  shoes that: Do not have high heels. Have rubber bottoms. Are comfortable and fit you well. Are closed at the toe. Do not wear sandals. If you use a stepladder: Make sure that it is fully opened. Do not climb a closed stepladder. Make sure that both sides of the stepladder are locked into place. Ask someone to hold it for you, if possible. Clearly mark and make sure that you can see: Any grab bars or handrails. First and last steps. Where the edge of each step is. Use tools that help you move around (mobility aids) if they are needed. These include: Canes. Walkers. Scooters. Crutches. Turn on the lights when you go into a dark area. Replace any light bulbs as soon as they burn out. Set up your furniture so you have a clear path. Avoid moving your furniture around. If any of your floors are uneven, fix them. If there are any pets around you, be aware of where they are. Review your medicines with your doctor. Some medicines can make you feel dizzy. This can increase your chance of falling. Ask your doctor what other things that you can do to help prevent falls. This information is not intended to replace advice given to you by your health care provider. Make sure you discuss any questions you have with your health care provider. Document Released: 12/11/2008 Document Revised: 07/23/2015 Document Reviewed: 03/21/2014 Elsevier Interactive Patient Education  2017 [employer].

## 2020-10-19 NOTE — Progress Notes (Addendum)
I connected with Shane Short today by telephone and verified that I am speaking with the correct person using two identifiers. Location patient: home Location provider: work Persons participating in the virtual visit: patient, provider.   I discussed the limitations, risks, security and privacy concerns of performing an evaluation and management service by telephone and the availability of in person appointments. I also discussed with the patient that there may be a patient responsible charge related to this service. The patient expressed understanding and verbally consented to this telephonic visit.    Interactive audio and video telecommunications were attempted between this provider and patient, however failed, due to patient having technical difficulties OR patient did not have access to video capability.  We continued and completed visit with audio only.  Some vital signs may be absent or patient reported.   Time Spent with patient on telephone encounter: 30 minutes  Subjective:   Shane Short is a 70 y.o. male who presents for Medicare Annual/Subsequent preventive examination.  Review of Systems     Cardiac Risk Factors include: advanced age (>39mn, >>25women);dyslipidemia;family history of premature cardiovascular disease;hypertension;male gender     Objective:    There were no vitals filed for this visit. There is no height or weight on file to calculate BMI.  Advanced Directives 10/19/2020 08/03/2014 12/13/2013  Does Patient Have a Medical Advance Directive? No No No  Would patient like information on creating a medical advance directive? No - Patient declined No - patient declined information No - patient declined information    Current Medications (verified) Outpatient Encounter Medications as of 10/19/2020  Medication Sig   aspirin 81 MG EC tablet Take 81 mg by mouth daily after breakfast.     Cholecalciferol (VITAMIN D3) 1000 UNITS tablet Take 1,000 Units by mouth  daily.     levothyroxine (EUTHYROX) 125 MCG tablet Take 1 tablet (125 mcg total) by mouth daily before breakfast. Annual appt due in August w/labs must see provider for future refills   metoprolol succinate (TOPROL-XL) 25 MG 24 hr tablet Take 1 tablet by mouth once daily Annual appt due in August must see provider for future refills   saw palmetto 160 MG capsule 1 po qd   vitamin B-12 (CYANOCOBALAMIN) 1000 MCG tablet Take 1,000 mcg by mouth daily.   No facility-administered encounter medications on file as of 10/19/2020.    Allergies (verified) Patient has no known allergies.   History: Past Medical History:  Diagnosis Date   Allergy    Dyslipidemia (high LDL; low HDL)    low hdl   Hypertension    OSA on CPAP    Sleep apnea    has a cpap- doesnt wear all the time    Thyroid disease    hypo   Vitamin B12 deficiency 2011   Past Surgical History:  Procedure Laterality Date   COLONOSCOPY     HERNIA REPAIR     ~30 yrs ago    lower venous doppler  05-02-2006   POLYPECTOMY     Family History  Problem Relation Age of Onset   Mental illness Mother        dementia   COPD Father    Lung cancer Father    Stroke Father        brain aneurism   Hypertension Other    Colon cancer Paternal Uncle    Colon polyps Neg Hx    Esophageal cancer Neg Hx    Rectal cancer Neg Hx  Stomach cancer Neg Hx    Social History   Socioeconomic History   Marital status: Married    Spouse name: Not on file   Number of children: Not on file   Years of education: Not on file   Highest education level: Not on file  Occupational History   Not on file  Tobacco Use   Smoking status: Former   Smokeless tobacco: Never   Tobacco comments:    quit 30 years ago  Substance and Sexual Activity   Alcohol use: No   Drug use: Yes    Types: [place]   Sexual activity: Yes  Other Topics Concern   Not on file  Social History Narrative   Not on file   Social Determinants of Health   Financial  Resource Strain: Low Risk    Difficulty of Paying Living Expenses: Not hard at all  Food Insecurity: No Food Insecurity   Worried About Charity fundraiser in the Last Year: Never true   [place] in the Last Year: Never true  Transportation Needs: No Transportation Needs   Lack of Transportation (Medical): No   Lack of Transportation (Non-Medical): No  Physical Activity: Sufficiently Active   Days of Exercise per Week: 5 days   Minutes of Exercise per Session: 60 min  Stress: No Stress Concern Present   Feeling of Stress : Not at all  Social Connections: Moderately Isolated   Frequency of Communication with Friends and Family: More than three times a week   Frequency of Social Gatherings with Friends and Family: Twice a week   Attends Religious Services: Never   Marine scientist or Organizations: No   Attends Music therapist: Never   Marital Status: Married    Tobacco Counseling Counseling given: Not Answered Tobacco comments: quit 30 years ago   Clinical Intake:  Pre-visit preparation completed: Yes  Pain : No/denies pain     Diabetes: No  How often do you need to have someone help you when you read instructions, pamphlets, or other written materials from your doctor or pharmacy?: 1 - Never What is the last grade level you completed in school?: 9th grade  Diabetic? no  Interpreter Needed?: No  Information entered by :: Lisette Abu, LPN   Activities of Daily Living In your present state of health, do you have any difficulty performing the following activities: 10/19/2020  Hearing? N  Vision? N  Difficulty concentrating or making decisions? N  Walking or climbing stairs? N  Dressing or bathing? N  Doing errands, shopping? N  Preparing Food and eating ? N  Using the Toilet? N  In the past six months, have you accidently leaked urine? N  Do you have problems with loss of bowel control? N  Managing your Medications? N  Managing  your Finances? N  Housekeeping or managing your Housekeeping? N  Some recent data might be hidden    Patient Care Team: Plotnikov, Evie Lacks, MD as PCP - General  Indicate any recent Medical Services you may have received from other than [facility] providers in the past year (date may be approximate).     Assessment:   This is a routine wellness examination for [place].  Hearing/Vision screen Hearing Screening - Comments:: Patient denied any hearing difficulty. Vision Screening - Comments:: Patient wears eye glasses.  Annual eye exam done by Dr. Alois Cliche.  Dietary issues and exercise activities discussed: Current Exercise Habits: Home exercise routine, Type of exercise:  walking;Other - see comments (yard work), Time (Minutes): 60, Frequency (Times/Week): 5, Weekly Exercise (Minutes/Week): 300, Intensity: Moderate, Exercise limited by: None identified   Goals Addressed               This Visit's Progress     Patient Stated (pt-stated)        My goal is to continue working on my weight, being physically active and independent.      Depression Screen PHQ 2/9 Scores 10/19/2020 09/30/2020 09/30/2019 09/11/2017 09/08/2016  PHQ - 2 Score 0 0 0 0 0  PHQ- 9 Score 0 0 - - -    Fall Risk Fall Risk  10/19/2020 09/30/2020 09/30/2019 09/11/2017 09/08/2016  Falls in the past year? 0 0 0 No No  Number falls in past yr: 0 0 0 - -  Injury with Fall? 0 0 0 - -  Risk for fall due to : No Fall Risks No Fall Risks - - -  Follow up Falls evaluation completed - - - -    FALL RISK PREVENTION PERTAINING TO THE HOME:  Any stairs in or around the home? No  If so, are there any without handrails? No  Home free of loose throw rugs in walkways, pet beds, electrical cords, etc? Yes  Adequate lighting in your home to reduce risk of falls? Yes   ASSISTIVE DEVICES UTILIZED TO PREVENT FALLS:  Life alert? No  Use of a cane, walker or w/c? No  Grab bars in the bathroom? No  Shower chair or bench in shower? No   Elevated toilet seat or a handicapped toilet? No   TIMED UP AND GO:  Was the test performed? No .  Length of time to ambulate 10 feet: n/a sec.   Gait steady and fast without use of assistive device  Cognitive Function: Normal cognitive status assessed by direct observation by this Nurse Health Advisor. No abnormalities found.          Immunizations Immunization History  Administered Date(s) Administered   Influenza Split 11/15/2010, 11/16/2011   Influenza Whole 03/15/2007, 12/23/2008, 11/13/2009   Influenza, High Dose Seasonal PF 12/17/2016   Influenza,inj,Quad PF,6+ Mos 11/13/2012, 12/19/2014, 11/24/2015   Influenza-Unspecified 11/28/2013   PFIZER(Purple Top)SARS-COV-2 Vaccination 04/06/2019, 04/30/2019, 12/19/2019, 06/19/2020   Pneumococcal Conjugate-13 09/08/2014   Pneumococcal Polysaccharide-23 09/02/2013, 09/26/2018   Tdap 08/27/2010   Zoster Recombinat (Shingrix) 11/19/2019, 01/31/2020   Zoster, Live 08/29/2012    TDAP status: Due, Education has been provided regarding the importance of this vaccine. Advised may receive this vaccine at local pharmacy or Health Dept. Aware to provide a copy of the vaccination record if obtained from local pharmacy or Health Dept. Verbalized acceptance and understanding.  Flu Vaccine status: Due, Education has been provided regarding the importance of this vaccine. Advised may receive this vaccine at local pharmacy or Health Dept. Aware to provide a copy of the vaccination record if obtained from local pharmacy or Health Dept. Verbalized acceptance and understanding.  Pneumococcal vaccine status: Up to date  Covid-19 vaccine status: Completed vaccines  Qualifies for Shingles Vaccine? Yes   Zostavax completed Yes   Shingrix Completed?: Yes  Screening Tests Health Maintenance  Topic Date Due   COVID-19 Vaccine (5 - Booster for Pfizer series) 10/19/2020   INFLUENZA VACCINE  09/28/2020   TETANUS/TDAP  08/26/2021 (Originally  08/26/2020)   COLONOSCOPY (Pts 45-87yr Insurance coverage will need to be confirmed)  12/10/2029   Hepatitis C Screening  Completed   PNA vac Low Risk Adult  Completed   Zoster Vaccines- Shingrix  Completed   HPV VACCINES  Aged Out    Health Maintenance  Health Maintenance Due  Topic Date Due   COVID-19 Vaccine (5 - Booster for Pfizer series) 10/19/2020   INFLUENZA VACCINE  09/28/2020    Colorectal cancer screening: Type of screening: Colonoscopy. Completed 12/11/2019. Repeat every 10 years  Lung Cancer Screening: (Low Dose CT Chest recommended if Age 69-80 years, 30 pack-year currently smoking OR have quit w/in 15years.) does not qualify.   Lung Cancer Screening Referral: no  Additional Screening:  Hepatitis C Screening: does qualify; Completed yes  Vision Screening: Recommended annual ophthalmology exams for early detection of glaucoma and other disorders of the eye. Is the patient up to date with their annual eye exam?  Yes  Who is the provider or what is the name of the office in which the patient attends annual eye exams? Alois Cliche, OD. If pt is not established with a provider, would they like to be referred to a provider to establish care? No .   Dental Screening: Recommended annual dental exams for proper oral hygiene  Community Resource Referral / Chronic Care Management: CRR required this visit?  No   CCM required this visit?  No      Plan:     I have personally reviewed and noted the following in the patient's chart:   Medical and social history Use of alcohol, tobacco or illicit drugs  Current medications and supplements including opioid prescriptions. Patient is not currently taking opioid prescriptions. Functional ability and status Nutritional status Physical activity Advanced directives List of other physicians Hospitalizations, surgeries, and ER visits in previous 12 months Vitals Screenings to include cognitive, depression, and falls Referrals  and appointments  In addition, I have reviewed and discussed with patient certain preventive protocols, quality metrics, and best practice recommendations. A written personalized care plan for preventive services as well as general preventive health recommendations were provided to patient.     Sheral Flow, LPN   [id number]   Nurse Notes:  Patient is cogitatively intact. There were no vitals filed for this visit. There is no height or weight on file to calculate BMI. Patient stated that she has no issues with gait or balance; does not use any assistive devices. Medications reviewed with patient; no opioid use noted.  Medical screening examination/treatment/procedure(s) were performed by non-physician practitioner and as supervising physician I was immediately available for consultation/collaboration.  I agree with above. Lew Dawes, MD

## 2020-12-22 ENCOUNTER — Other Ambulatory Visit: Payer: Self-pay | Admitting: Internal Medicine

## 2021-03-31 DIAGNOSIS — H5203 Hypermetropia, bilateral: Secondary | ICD-10-CM | POA: Diagnosis not present

## 2021-09-07 ENCOUNTER — Other Ambulatory Visit: Payer: Self-pay | Admitting: Internal Medicine

## 2021-10-04 ENCOUNTER — Ambulatory Visit (INDEPENDENT_AMBULATORY_CARE_PROVIDER_SITE_OTHER): Payer: Medicare HMO | Admitting: Internal Medicine

## 2021-10-04 ENCOUNTER — Telehealth: Payer: Self-pay | Admitting: Internal Medicine

## 2021-10-04 ENCOUNTER — Encounter: Payer: Self-pay | Admitting: Internal Medicine

## 2021-10-04 VITALS — BP 128/72 | HR 54 | Temp 98.9°F | Ht 69.0 in | Wt 196.8 lb

## 2021-10-04 DIAGNOSIS — I10 Essential (primary) hypertension: Secondary | ICD-10-CM | POA: Diagnosis not present

## 2021-10-04 DIAGNOSIS — Z136 Encounter for screening for cardiovascular disorders: Secondary | ICD-10-CM | POA: Diagnosis not present

## 2021-10-04 DIAGNOSIS — Z0000 Encounter for general adult medical examination without abnormal findings: Secondary | ICD-10-CM | POA: Diagnosis not present

## 2021-10-04 DIAGNOSIS — E538 Deficiency of other specified B group vitamins: Secondary | ICD-10-CM | POA: Diagnosis not present

## 2021-10-04 LAB — COMPREHENSIVE METABOLIC PANEL
ALT: 18 U/L (ref 0–53)
AST: 21 U/L (ref 0–37)
Albumin: 4.5 g/dL (ref 3.5–5.2)
Alkaline Phosphatase: 54 U/L (ref 39–117)
BUN: 18 mg/dL (ref 6–23)
CO2: 28 mEq/L (ref 19–32)
Calcium: 9.5 mg/dL (ref 8.4–10.5)
Chloride: 101 mEq/L (ref 96–112)
Creatinine, Ser: 1.07 mg/dL (ref 0.40–1.50)
GFR: 70.51 mL/min (ref >60.00)
Glucose, Bld: 100 mg/dL — ABNORMAL HIGH (ref 70–99)
Potassium: 4.5 mEq/L (ref 3.5–5.1)
Sodium: 141 mEq/L (ref 135–145)
Total Bilirubin: 0.7 mg/dL (ref 0.2–1.2)
Total Protein: 7.6 g/dL (ref 6.0–8.3)

## 2021-10-04 LAB — CBC WITH DIFFERENTIAL/PLATELET
Basophils Absolute: 0 10*3/uL (ref 0.0–0.1)
Basophils Relative: 0.9 % (ref 0.0–3.0)
Eosinophils Absolute: 0.5 10*3/uL (ref 0.0–0.7)
Eosinophils Relative: 9.7 % — ABNORMAL HIGH (ref 0.0–5.0)
HCT: 40.5 % (ref 39.0–52.0)
Hemoglobin: 13.8 g/dL (ref 13.0–17.0)
Lymphocytes Relative: 33.4 % (ref 12.0–46.0)
Lymphs Abs: 1.7 10*3/uL (ref 0.7–4.0)
MCHC: 34 g/dL (ref 30.0–36.0)
MCV: 86.9 fl (ref 78.0–100.0)
Monocytes Absolute: 0.5 10*3/uL (ref 0.1–1.0)
Monocytes Relative: 10.5 % (ref 3.0–12.0)
Neutro Abs: 2.3 10*3/uL (ref 1.4–7.7)
Neutrophils Relative %: 45.5 % (ref 43.0–77.0)
Platelets: 200 10*3/uL (ref 150.0–400.0)
RBC: 4.66 Mil/uL (ref 4.22–5.81)
RDW: 13 % (ref 11.5–15.5)
WBC: 5.2 10*3/uL (ref 4.0–10.5)

## 2021-10-04 LAB — URINALYSIS
Bilirubin Urine: NEGATIVE
Hgb urine dipstick: NEGATIVE
Ketones, ur: NEGATIVE
Leukocytes,Ua: NEGATIVE
Nitrite: NEGATIVE
Specific Gravity, Urine: 1.02 (ref 1.000–1.030)
Total Protein, Urine: NEGATIVE
Urine Glucose: NEGATIVE
Urobilinogen, UA: 1 (ref 0.0–1.0)
pH: 6.5 (ref 5.0–8.0)

## 2021-10-04 LAB — LIPID PANEL
Cholesterol: 210 mg/dL — ABNORMAL HIGH (ref 0–200)
HDL: 38.5 mg/dL — ABNORMAL LOW (ref >39.00)
LDL Cholesterol: 149 mg/dL — ABNORMAL HIGH (ref 0–99)
NonHDL: 171.69
Total CHOL/HDL Ratio: 5
Triglycerides: 114 mg/dL (ref 0.0–149.0)
VLDL: 22.8 mg/dL (ref 0.0–40.0)

## 2021-10-04 LAB — PSA: PSA: 0.56 ng/mL (ref 0.10–4.00)

## 2021-10-04 LAB — TSH: TSH: 8.13 u[IU]/mL — ABNORMAL HIGH (ref 0.35–5.50)

## 2021-10-04 MED ORDER — METOPROLOL SUCCINATE ER 25 MG PO TB24: 12.5000 mg | ORAL_TABLET | Freq: Every day | ORAL | 1 refills | Status: DC

## 2021-10-04 NOTE — Progress Notes (Signed)
Subjective:  Patient ID: Shane Short, male    DOB: 03/20/1950  Age: 71 y.o. MRN: [id number]  CC: Annual Exam (Patient c/o having some lightheadedness when doing sudden movements)   HPI Shane Short presents for a well exam  Outpatient Medications Prior to Visit  Medication Sig Dispense Refill   aspirin 81 MG EC tablet Take 81 mg by mouth daily after breakfast.       Cholecalciferol (VITAMIN D3) 1000 UNITS tablet Take 1,000 Units by mouth daily.       levothyroxine (EUTHYROX) 125 MCG tablet Take 1 tablet (125 mcg total) by mouth daily before breakfast. Annual appt due in August w/labs must see provider for future refills 90 tablet 3   saw palmetto 160 MG capsule 1 po qd 100 capsule 3   vitamin B-12 (CYANOCOBALAMIN) 1000 MCG tablet Take 1,000 mcg by mouth daily.     metoprolol succinate (TOPROL-XL) 25 MG 24 hr tablet TAKE 1 TABLET BY MOUTH ONCE DAILY 90 tablet 0   No facility-administered medications prior to visit.    ROS: Review of Systems  Constitutional:  Negative for appetite change, fatigue and unexpected weight change.  HENT:  Negative for congestion, nosebleeds, sneezing, sore throat and trouble swallowing.   Eyes:  Negative for itching and visual disturbance.  Respiratory:  Negative for cough.   Cardiovascular:  Negative for chest pain, palpitations and leg swelling.  Gastrointestinal:  Negative for abdominal distention, blood in stool, diarrhea and nausea.  Genitourinary:  Negative for frequency and hematuria.  Musculoskeletal:  Positive for arthralgias. Negative for back pain, gait problem, joint swelling and neck pain.  Skin:  Negative for rash.  Neurological:  Positive for light-headedness. Negative for dizziness, tremors, speech difficulty and weakness.  Psychiatric/Behavioral:  Negative for agitation, dysphoric mood and sleep disturbance. The patient is not nervous/anxious.     Objective:  BP 128/72 (BP Location: Right Arm, Patient Position: Sitting, Cuff  Size: Large)   Pulse (!) 54   Temp 98.9 F (37.2 C) (Oral)   Ht '5\' 9"'$  (1.753 m)   Wt 196 lb 12.8 oz (89.3 kg)   SpO2 96%   BMI 29.06 kg/m   BP Readings from Last 3 Encounters:  10/04/21 128/72  09/30/20 120/68  12/11/19 121/74    Wt Readings from Last 3 Encounters:  10/04/21 196 lb 12.8 oz (89.3 kg)  09/30/20 191 lb 12.8 oz (87 kg)  12/11/19 197 lb (89.4 kg)    Physical Exam Constitutional:      General: He is not in acute distress.    Appearance: He is well-developed. He is obese.     Comments: NAD  Eyes:     Conjunctiva/sclera: Conjunctivae normal.     Pupils: Pupils are equal, round, and reactive to light.  Neck:     Thyroid: No thyromegaly.     Vascular: No JVD.  Cardiovascular:     Rate and Rhythm: Normal rate and regular rhythm.     Heart sounds: Normal heart sounds. No murmur heard.    No friction rub. No gallop.  Pulmonary:     Effort: Pulmonary effort is normal. No respiratory distress.     Breath sounds: Normal breath sounds. No wheezing or rales.  Chest:     Chest wall: No tenderness.  Abdominal:     General: Bowel sounds are normal. There is no distension.     Palpations: Abdomen is soft. There is no mass.     Tenderness: There is no abdominal  tenderness. There is no guarding or rebound.  Genitourinary:    Rectum: Normal.  Musculoskeletal:        General: No tenderness. Normal range of motion.     Cervical back: Normal range of motion.  Lymphadenopathy:     Cervical: No cervical adenopathy.  Skin:    General: Skin is warm and dry.     Findings: No rash.  Neurological:     Mental Status: He is alert and oriented to person, place, and time.     Cranial Nerves: No cranial nerve deficit.     Motor: No abnormal muscle tone.     Coordination: Coordination normal.     Gait: Gait normal.     Deep Tendon Reflexes: Reflexes are normal and symmetric.  Psychiatric:        Behavior: Behavior normal.        Thought Content: Thought content normal.         Judgment: Judgment normal.   Prostate normal to 1+, NT  Lab Results  Component Value Date   WBC 5.1 09/30/2020   HGB 13.9 09/30/2020   HCT 40.4 09/30/2020   PLT 192.0 09/30/2020   GLUCOSE 94 09/30/2020   CHOL 192 09/30/2020   TRIG 137.0 09/30/2020   HDL 38.70 (L) 09/30/2020   LDLDIRECT 98.0 09/26/2018   LDLCALC 126 (H) 09/30/2020   ALT 20 09/30/2020   AST 23 09/30/2020   NA 140 09/30/2020   K 4.3 09/30/2020   CL 106 09/30/2020   CREATININE 0.93 09/30/2020   BUN 18 09/30/2020   CO2 28 09/30/2020   TSH 2.38 09/30/2020   PSA 0.54 09/30/2020    CT Renal Stone Study  Result Date: 11/09/2016 CLINICAL DATA:  Left flank pain. EXAM: CT ABDOMEN AND PELVIS WITHOUT CONTRAST TECHNIQUE: Multidetector CT imaging of the abdomen and pelvis was performed following the standard protocol without IV contrast. COMPARISON:  CT 08/03/2014 FINDINGS: Lower chest: The lung bases are clear. Hepatobiliary: Mild hepatic steatosis, no evidence of focal lesion. Gallbladder physiologically distended, no calcified stone. No biliary dilatation. Pancreas: No ductal dilatation or inflammation. Spleen: Normal in size without focal abnormality. Adrenals/Urinary Tract: Normal adrenal glands. Obstructing 4 x 4 mm stone in the distal left ureter just proximal to the ureterovesicular junction with moderate left hydroureteronephrosis. Moderate left perinephric edema. Multiple, at least 7, nonobstructing stones in the left kidney. No right hydronephrosis or hydroureter. Multiple, at least 8, nonobstructing stones in the right kidney. Urinary bladder is physiologically distended without stone or wall thickening. Stomach/Bowel: Diverticulosis throughout the entire colon without acute diverticulitis. Appendix not confidently visualized. No small bowel dilatation or inflammation. Stomach is nondistended. Vascular/Lymphatic: Mild aortic and branch atherosclerosis without aneurysm. No abdominal or pelvic adenopathy. Reproductive:  Prostate is unremarkable. Other: No free air, free fluid, or intra-abdominal fluid collection. Tiny fat containing umbilical hernia. Musculoskeletal: There are no acute or suspicious osseous abnormalities. IMPRESSION: 1. Obstructing 4 mm stone in the distal left ureter just proximal to the ureterovesicular junction with moderate hydronephrosis and perinephric edema. 2. Multiple additional nonobstructing bilateral renal calculi. 3. Multifocal colonic diverticulosis throughout the entire colon. No acute diverticulitis. 4. Hepatic steatosis. 5.  Aortic Atherosclerosis (ICD10-I70.0). Electronically Signed   By: Jeb Levering M.D.   On: 11/09/2016 05:42    Assessment & Plan:   Problem List Items Addressed This Visit     B12 deficiency    On B12      Essential hypertension    Chronic. Reduce Toprol XL to 1/2  tab due to lightheadedness      Relevant Medications   metoprolol succinate (TOPROL-XL) 25 MG 24 hr tablet   Well adult exam - Primary     We discussed age appropriate health related issues, including available/recomended screening tests and vaccinations. Labs were ordered to be later reviewed . All questions were answered. We discussed one or more of the following - seat belt use, use of sunscreen/sun exposure exercise, fall risk reduction, second hand smoke exposure, firearm use and storage, seat belt use, a need for adhering to healthy diet and exercise. Labs were ordered.  All questions were answered. A cardiac CT scan for coronary calcium offered 7/20, 7/21 Colon 2011, 2021      Relevant Orders   TSH   Urinalysis   CBC with Differential/Platelet   Lipid panel   Comprehensive metabolic panel   PSA      Meds ordered this encounter  Medications   metoprolol succinate (TOPROL-XL) 25 MG 24 hr tablet    Sig: Take 0.5 tablets (12.5 mg total) by mouth daily. TAKE 1 TABLET BY MOUTH ONCE DAILY    Dispense:  90 tablet    Refill:  1    Annual appt due in Aug must see provider for  future refills      Follow-up: Return in about 1 year (around 10/05/2022) for a follow-up visit.  Walker Kehr, MD

## 2021-10-04 NOTE — Telephone Encounter (Signed)
Pharmacy called and wants to know if patient is to take 1/2 tablet metoprolol or the whole tablet.  Please advise.

## 2021-10-04 NOTE — Assessment & Plan Note (Signed)
On B12 

## 2021-10-04 NOTE — Patient Instructions (Signed)
For a mild COVID-19 case - take zinc 50 mg a day for 1 week, vitamin C 1000 mg daily for 1 week, vitamin D2 50,000 units weekly for 2 months (unless  taking vitamin D daily already), an antioxidant Quercetin 500 mg twice a day for 1 week (if you can get it quick enough). Take Allegra or Benadryl.  Maintain good oral hydration and take Tylenol for high fever.  Call if problems. Isolate for 5 days, then wear a mask for 5 days per CDC.  

## 2021-10-04 NOTE — Assessment & Plan Note (Signed)
  We discussed age appropriate health related issues, including available/recomended screening tests and vaccinations. Labs were ordered to be later reviewed . All questions were answered. We discussed one or more of the following - seat belt use, use of sunscreen/sun exposure exercise, fall risk reduction, second hand smoke exposure, firearm use and storage, seat belt use, a need for adhering to healthy diet and exercise. Labs were ordered.  All questions were answered. A cardiac CT scan for coronary calcium offered 7/20, 7/21 Colon 2011, 2021

## 2021-10-04 NOTE — Telephone Encounter (Signed)
Pls clarify there is two directions.Marland KitchenChryl Heck

## 2021-10-04 NOTE — Assessment & Plan Note (Signed)
Chronic. Reduce Toprol XL to 1/2 tab due to lightheadedness

## 2021-10-05 MED ORDER — METOPROLOL SUCCINATE ER 25 MG PO TB24: 12.5000 mg | ORAL_TABLET | Freq: Every day | ORAL | 1 refills | Status: DC

## 2021-10-05 NOTE — Telephone Encounter (Signed)
I am sorry thanks 1/2 tablet is correct.  New prescription sent.  Thanks

## 2021-10-06 ENCOUNTER — Other Ambulatory Visit (INDEPENDENT_AMBULATORY_CARE_PROVIDER_SITE_OTHER): Payer: Medicare HMO

## 2021-10-06 DIAGNOSIS — E039 Hypothyroidism, unspecified: Secondary | ICD-10-CM | POA: Diagnosis not present

## 2021-10-06 LAB — T4, FREE: Free T4: 0.99 ng/dL (ref 0.60–1.60)

## 2021-10-14 DIAGNOSIS — C4441 Basal cell carcinoma of skin of scalp and neck: Secondary | ICD-10-CM | POA: Diagnosis not present

## 2021-10-14 DIAGNOSIS — L821 Other seborrheic keratosis: Secondary | ICD-10-CM | POA: Diagnosis not present

## 2021-10-14 DIAGNOSIS — D225 Melanocytic nevi of trunk: Secondary | ICD-10-CM | POA: Diagnosis not present

## 2021-10-14 DIAGNOSIS — L570 Actinic keratosis: Secondary | ICD-10-CM | POA: Diagnosis not present

## 2021-10-14 DIAGNOSIS — L814 Other melanin hyperpigmentation: Secondary | ICD-10-CM | POA: Diagnosis not present

## 2021-10-14 DIAGNOSIS — D485 Neoplasm of uncertain behavior of skin: Secondary | ICD-10-CM | POA: Diagnosis not present

## 2021-10-20 ENCOUNTER — Ambulatory Visit (INDEPENDENT_AMBULATORY_CARE_PROVIDER_SITE_OTHER): Payer: Medicare HMO

## 2021-10-20 VITALS — Ht 69.0 in | Wt 197.0 lb

## 2021-10-20 DIAGNOSIS — Z0000 Encounter for general adult medical examination without abnormal findings: Secondary | ICD-10-CM

## 2021-10-20 NOTE — Progress Notes (Addendum)
I connected with [name] today by telephone and verified that I am speaking with the correct person using two identifiers. Location patient: home Location provider: work Persons participating in the virtual visit: [name], Glenna Durand LPN.   I discussed the limitations, risks, security and privacy concerns of performing an evaluation and management service by telephone and the availability of in person appointments. I also discussed with the patient that there may be a patient responsible charge related to this service. The patient expressed understanding and verbally consented to this telephonic visit.    Interactive audio and video telecommunications were attempted between this provider and patient, however failed, due to patient having technical difficulties OR patient did not have access to video capability.  We continued and completed visit with audio only.     Vital signs may be patient reported or missing.  Subjective:   Shane Short is a 71 y.o. male who presents for Medicare Annual/Subsequent preventive examination.  Review of Systems     Cardiac Risk Factors include: advanced age (>55mn, >>23women);dyslipidemia;hypertension;male gender     Objective:    Today's Vitals   10/20/21 0956  Weight: 197 lb (89.4 kg)  Height: '5\' 9"'$  (1.753 m)   Body mass index is 29.09 kg/m.     10/20/2021   10:01 AM 10/19/2020    9:54 AM 08/03/2014    7:54 PM 12/13/2013   12:38 PM  Advanced Directives  Does Patient Have a Medical Advance Directive? No No No No  Would patient like information on creating a medical advance directive?  No - Patient declined No - patient declined information No - patient declined information    Current Medications (verified) Outpatient Encounter Medications as of 10/20/2021  Medication Sig   aspirin 81 MG EC tablet Take 81 mg by mouth daily after breakfast.     Cholecalciferol (VITAMIN D3) 1000 UNITS tablet Take 1,000 Units by mouth daily.      levothyroxine (EUTHYROX) 125 MCG tablet Take 1 tablet (125 mcg total) by mouth daily before breakfast. Annual appt due in August w/labs must see provider for future refills   metoprolol succinate (TOPROL-XL) 25 MG 24 hr tablet Take 0.5 tablets (12.5 mg total) by mouth daily.   saw palmetto 160 MG capsule 1 po qd   vitamin B-12 (CYANOCOBALAMIN) 1000 MCG tablet Take 1,000 mcg by mouth daily.   No facility-administered encounter medications on file as of 10/20/2021.    Allergies (verified) Patient has no known allergies.   History: Past Medical History:  Diagnosis Date   Allergy    Dyslipidemia (high LDL; low HDL)    low hdl   Hypertension    OSA on CPAP    Sleep apnea    has a cpap- doesnt wear all the time    Thyroid disease    hypo   Vitamin B12 deficiency 2011   Past Surgical History:  Procedure Laterality Date   COLONOSCOPY     HERNIA REPAIR     ~30 yrs ago    lower venous doppler  05-02-2006   POLYPECTOMY     Family History  Problem Relation Age of Onset   Mental illness Mother        dementia   COPD Father    Lung cancer Father    Stroke Father        brain aneurism   Hypertension Other    Colon cancer Paternal Uncle    Colon polyps Neg Hx    Esophageal  cancer Neg Hx    Rectal cancer Neg Hx    Stomach cancer Neg Hx    Social History   Socioeconomic History   Marital status: Married    Spouse name: Not on file   Number of children: Not on file   Years of education: Not on file   Highest education level: Not on file  Occupational History   Not on file  Tobacco Use   Smoking status: Former   Smokeless tobacco: Never   Tobacco comments:    quit 30 years ago  Vaping Use   Vaping Use: Never used  Substance and Sexual Activity   Alcohol use: No   Drug use: Not Currently    Types: Mescaline   Sexual activity: Yes  Other Topics Concern   Not on file  Social History Narrative   Not on file   Social Determinants of Health   Financial Resource  Strain: Low Risk  (10/20/2021)   Overall Financial Resource Strain (CARDIA)    Difficulty of Paying Living Expenses: Not hard at all  Food Insecurity: No Food Insecurity (10/20/2021)   Hunger Vital Sign    Worried About Running Out of Food in the Last Year: Never true    [place] in the Last Year: Never true  Transportation Needs: No Transportation Needs (10/20/2021)   PRAPARE - Hydrologist (Medical): No    Lack of Transportation (Non-Medical): No  Physical Activity: Sufficiently Active (10/20/2021)   Exercise Vital Sign    Days of Exercise per Week: 7 days    Minutes of Exercise per Session: 40 min  Stress: No Stress Concern Present (10/20/2021)   [place]    Feeling of Stress : Not at all  Social Connections: Moderately Isolated (10/19/2020)   Social Connection and Isolation Panel [NHANES]    Frequency of Communication with Friends and Family: More than three times a week    Frequency of Social Gatherings with Friends and Family: Twice a week    Attends Religious Services: Never    Marine scientist or Organizations: No    Attends Music therapist: Never    Marital Status: Married    Tobacco Counseling Counseling given: Not Answered Tobacco comments: quit 30 years ago   Clinical Intake:  Pre-visit preparation completed: Yes  Pain : No/denies pain     Nutritional Status: BMI 25 -29 Overweight Nutritional Risks: None Diabetes: No  How often do you need to have someone help you when you read instructions, pamphlets, or other written materials from your doctor or pharmacy?: 1 - Never What is the last grade level you completed in school?: 10th grade  Diabetic? no  Interpreter Needed?: No  Information entered by :: NAllen LPN   Activities of Daily Living    10/20/2021   10:02 AM  In your present state of health, do you have any difficulty performing  the following activities:  Hearing? 0  Vision? 0  Difficulty concentrating or making decisions? 0  Walking or climbing stairs? 0  Dressing or bathing? 0  Doing errands, shopping? 0  Preparing Food and eating ? N  Using the Toilet? N  In the past six months, have you accidently leaked urine? N  Do you have problems with loss of bowel control? N  Managing your Medications? N  Managing your Finances? N  Housekeeping or managing your Housekeeping? N    Patient  Care Team: Plotnikov, Evie Lacks, MD as PCP - General Dunn, Warnell Bureau as Consulting Physician (Optometry)  Indicate any recent Medical Services you may have received from other than [facility] providers in the past year (date may be approximate).     Assessment:   This is a routine wellness examination for Shane Short.  Hearing/Vision screen Vision Screening - Comments:: Regular eye exams, Dr. Idolina Primer  Dietary issues and exercise activities discussed: Current Exercise Habits: Home exercise routine, Type of exercise: walking, Time (Minutes): 40, Frequency (Times/Week): 7, Weekly Exercise (Minutes/Week): 280   Goals Addressed             This Visit's Progress    Patient Stated       10/20/2021, wants to lose weight       Depression Screen    10/20/2021   10:02 AM 10/04/2021    7:56 AM 10/19/2020    9:58 AM 09/30/2020    8:02 AM 09/30/2019    8:00 AM 09/11/2017    8:38 AM 09/08/2016    8:10 AM  PHQ 2/9 Scores  PHQ - 2 Score 0 0 0 0 0 0 0  PHQ- 9 Score  0 0 0       Fall Risk    10/20/2021   10:02 AM 10/04/2021    7:56 AM 10/19/2020    9:55 AM 09/30/2020    8:01 AM 09/30/2019    8:00 AM  Fall Risk   Falls in the past year? 0 0 0 0 0  Number falls in past yr: 0 0 0 0 0  Injury with Fall? 0 0 0 0 0  Risk for fall due to : Medication side effect  No Fall Risks No Fall Risks   Follow up Falls evaluation completed;Education provided;Falls prevention discussed  Falls evaluation completed      FALL RISK PREVENTION PERTAINING TO THE  HOME:  Any stairs in or around the home? No  If so, are there any without handrails? N/a Home free of loose throw rugs in walkways, pet beds, electrical cords, etc? Yes  Adequate lighting in your home to reduce risk of falls? Yes   ASSISTIVE DEVICES UTILIZED TO PREVENT FALLS:  Life alert? No  Use of a cane, walker or w/c? No  Grab bars in the bathroom? No  Shower chair or bench in shower? No  Elevated toilet seat or a handicapped toilet? Yes   TIMED UP AND GO:  Was the test performed? No .      Cognitive Function:        10/20/2021   10:04 AM  6CIT Screen  What Year? 0 points  What month? 0 points  What time? 0 points  Count back from 20 0 points  Months in reverse 0 points  Repeat phrase 0 points  Total Score 0 points    Immunizations Immunization History  Administered Date(s) Administered   Influenza Split 11/15/2010, 11/16/2011   Influenza Whole 03/15/2007, 12/23/2008, 11/13/2009   Influenza, High Dose Seasonal PF 12/17/2016   Influenza,inj,Quad PF,6+ Mos 11/13/2012, 12/19/2014, 11/24/2015   Influenza-Unspecified 11/28/2013   PFIZER(Purple Top)SARS-COV-2 Vaccination 04/06/2019, 04/30/2019, 12/19/2019, 06/19/2020   Pneumococcal Conjugate-13 09/08/2014   Pneumococcal Polysaccharide-23 09/02/2013, 09/26/2018   Tdap 08/27/2010   Zoster Recombinat (Shingrix) 11/19/2019, 01/31/2020   Zoster, Live 08/29/2012    TDAP status: Due, Education has been provided regarding the importance of this vaccine. Advised may receive this vaccine at local pharmacy or Health Dept. Aware to provide a copy of the vaccination  record if obtained from local pharmacy or Health Dept. Verbalized acceptance and understanding.  Flu Vaccine status: Due, Education has been provided regarding the importance of this vaccine. Advised may receive this vaccine at local pharmacy or Health Dept. Aware to provide a copy of the vaccination record if obtained from local pharmacy or Health Dept. Verbalized  acceptance and understanding.  Pneumococcal vaccine status: Up to date  Covid-19 vaccine status: Completed vaccines  Qualifies for Shingles Vaccine? Yes   Zostavax completed Yes   Shingrix Completed?: Yes  Screening Tests Health Maintenance  Topic Date Due   INFLUENZA VACCINE  09/28/2021   COVID-19 Vaccine (5 - Pfizer risk series) 10/20/2021 (Originally 08/14/2020)   TETANUS/TDAP  10/05/2022 (Originally 08/26/2020)   COLONOSCOPY (Pts 45-73yr Insurance coverage will need to be confirmed)  12/10/2029   Pneumonia Vaccine 70 Years old  Completed   Hepatitis C Screening  Completed   Zoster Vaccines- Shingrix  Completed   HPV VACCINES  Aged Out    Health Maintenance  Health Maintenance Due  Topic Date Due   INFLUENZA VACCINE  09/28/2021    Colorectal cancer screening: Type of screening: Colonoscopy. Completed 12/11/2019. Repeat every 10 years  Lung Cancer Screening: (Low Dose CT Chest recommended if Age 750-80years, 30 pack-year currently smoking OR have quit w/in 15years.) does not qualify.   Lung Cancer Screening Referral: no  Additional Screening:  Hepatitis C Screening: does qualify; Completed 09/07/2015  Vision Screening: Recommended annual ophthalmology exams for early detection of glaucoma and other disorders of the eye. Is the patient up to date with their annual eye exam?  Yes  Who is the provider or what is the name of the office in which the patient attends annual eye exams? Dr. DIdolina PrimerIf pt is not established with a provider, would they like to be referred to a provider to establish care? No .   Dental Screening: Recommended annual dental exams for proper oral hygiene  Community Resource Referral / Chronic Care Management: CRR required this visit?  No   CCM required this visit?  No      Plan:     I have personally reviewed and noted the following in the patient's chart:   Medical and social history Use of alcohol, tobacco or illicit drugs  Current  medications and supplements including opioid prescriptions. Patient is not currently taking opioid prescriptions. Functional ability and status Nutritional status Physical activity Advanced directives List of other physicians Hospitalizations, surgeries, and ER visits in previous 12 months Vitals Screenings to include cognitive, depression, and falls Referrals and appointments  In addition, I have reviewed and discussed with patient certain preventive protocols, quality metrics, and best practice recommendations. A written personalized care plan for preventive services as well as general preventive health recommendations were provided to patient.     NKellie Simmering LPN   [id number]  Nurse Notes: none  Due to this being a virtual visit, the after visit summary with patients personalized plan was offered to patient via mail or my-chart.  Patient would like to access on my-chart  Medical screening examination/treatment/procedure(s) were performed by non-physician practitioner and as supervising physician I was immediately available for consultation/collaboration.  I agree with above. ALew Dawes MD

## 2021-10-20 NOTE — Patient Instructions (Signed)
Shane Short , Thank you for taking time to come for your Medicare Wellness Visit. I appreciate your ongoing commitment to your health goals. Please review the following plan we discussed and let me know if I can assist you in the future.   Screening recommendations/referrals: Colonoscopy: completed 12/11/2019, duue 12/10/2029 Recommended yearly ophthalmology/optometry visit for glaucoma screening and checkup Recommended yearly dental visit for hygiene and checkup  Vaccinations: Influenza vaccine: due Pneumococcal vaccine: completed 09/26/2018 Tdap vaccine: due Shingles vaccine: completed   Covid-19:  06/19/2020, 12/19/2019, 04/30/2019, 04/06/2019  Advanced directives: Advance directive discussed with you today.  Conditions/risks identified: none  Next appointment: Follow up in one year for your annual wellness visit.   Preventive Care 20 Years and Older, Male Preventive care refers to lifestyle choices and visits with your health care provider that can promote health and wellness. What does preventive care include? A yearly physical exam. This is also called an annual well check. Dental exams once or twice a year. Routine eye exams. Ask your health care provider how often you should have your eyes checked. Personal lifestyle choices, including: Daily care of your teeth and gums. Regular physical activity. Eating a healthy diet. Avoiding tobacco and drug use. Limiting alcohol use. Practicing safe sex. Taking low doses of aspirin every day. Taking vitamin and mineral supplements as recommended by your health care provider. What happens during an annual well check? The services and screenings done by your health care provider during your annual well check will depend on your age, overall health, lifestyle risk factors, and family history of disease. Counseling  Your health care provider may ask you questions about your: Alcohol use. Tobacco use. Drug use. Emotional well-being. Home  and relationship well-being. Sexual activity. Eating habits. History of falls. Memory and ability to understand (cognition). Work and work Statistician. Screening  You may have the following tests or measurements: Height, weight, and BMI. Blood pressure. Lipid and cholesterol levels. These may be checked every 5 years, or more frequently if you are over 59 years old. Skin check. Lung cancer screening. You may have this screening every year starting at age 70 if you have a 30-pack-year history of smoking and currently smoke or have quit within the past 15 years. Fecal occult blood test (FOBT) of the stool. You may have this test every year starting at age 34. Flexible sigmoidoscopy or colonoscopy. You may have a sigmoidoscopy every 5 years or a colonoscopy every 10 years starting at age 68. Prostate cancer screening. Recommendations will vary depending on your family history and other risks. Hepatitis C blood test. Hepatitis B blood test. Sexually transmitted disease (STD) testing. Diabetes screening. This is done by checking your blood sugar (glucose) after you have not eaten for a while (fasting). You may have this done every 1-3 years. Abdominal aortic aneurysm (AAA) screening. You may need this if you are a current or former smoker. Osteoporosis. You may be screened starting at age 30 if you are at high risk. Talk with your health care provider about your test results, treatment options, and if necessary, the need for more tests. Vaccines  Your health care provider may recommend certain vaccines, such as: Influenza vaccine. This is recommended every year. Tetanus, diphtheria, and acellular pertussis (Tdap, Td) vaccine. You may need a Td booster every 10 years. Zoster vaccine. You may need this after age 68. Pneumococcal 13-valent conjugate (PCV13) vaccine. One dose is recommended after age 90. Pneumococcal polysaccharide (PPSV23) vaccine. One dose is recommended after age  34. Talk to  your health care provider about which screenings and vaccines you need and how often you need them. This information is not intended to replace advice given to you by your health care provider. Make sure you discuss any questions you have with your health care provider. Document Released: 03/13/2015 Document Revised: 11/04/2015 Document Reviewed: 12/16/2014 Elsevier Interactive Patient Education  2017 [employer] Prevention in the Home Falls can cause injuries. They can happen to people of all ages. There are many things you can do to make your home safe and to help prevent falls. What can I do on the outside of my home? Regularly fix the edges of walkways and driveways and fix any cracks. Remove anything that might make you trip as you walk through a door, such as a raised step or threshold. Trim any bushes or trees on the path to your home. Use bright outdoor lighting. Clear any walking paths of anything that might make someone trip, such as rocks or tools. Regularly check to see if handrails are loose or broken. Make sure that both sides of any steps have handrails. Any raised decks and porches should have guardrails on the edges. Have any leaves, snow, or ice cleared regularly. Use sand or salt on walking paths during winter. Clean up any spills in your garage right away. This includes oil or grease spills. What can I do in the bathroom? Use night lights. Install grab bars by the toilet and in the tub and shower. Do not use towel bars as grab bars. Use non-skid mats or decals in the tub or shower. If you need to sit down in the shower, use a plastic, non-slip stool. Keep the floor dry. Clean up any water that spills on the floor as soon as it happens. Remove soap buildup in the tub or shower regularly. Attach bath mats securely with double-sided non-slip rug tape. Do not have throw rugs and other things on the floor that can make you trip. What can I do in the bedroom? Use  night lights. Make sure that you have a light by your bed that is easy to reach. Do not use any sheets or blankets that are too big for your bed. They should not hang down onto the floor. Have a firm chair that has side arms. You can use this for support while you get dressed. Do not have throw rugs and other things on the floor that can make you trip. What can I do in the kitchen? Clean up any spills right away. Avoid walking on wet floors. Keep items that you use a lot in easy-to-reach places. If you need to reach something above you, use a strong step stool that has a grab bar. Keep electrical cords out of the way. Do not use floor polish or wax that makes floors slippery. If you must use wax, use non-skid floor wax. Do not have throw rugs and other things on the floor that can make you trip. What can I do with my stairs? Do not leave any items on the stairs. Make sure that there are handrails on both sides of the stairs and use them. Fix handrails that are broken or loose. Make sure that handrails are as long as the stairways. Check any carpeting to make sure that it is firmly attached to the stairs. Fix any carpet that is loose or worn. Avoid having throw rugs at the top or bottom of the stairs. If you do have  throw rugs, attach them to the floor with carpet tape. Make sure that you have a light switch at the top of the stairs and the bottom of the stairs. If you do not have them, ask someone to add them for you. What else can I do to help prevent falls? Wear shoes that: Do not have high heels. Have rubber bottoms. Are comfortable and fit you well. Are closed at the toe. Do not wear sandals. If you use a stepladder: Make sure that it is fully opened. Do not climb a closed stepladder. Make sure that both sides of the stepladder are locked into place. Ask someone to hold it for you, if possible. Clearly mark and make sure that you can see: Any grab bars or handrails. First and last  steps. Where the edge of each step is. Use tools that help you move around (mobility aids) if they are needed. These include: Canes. Walkers. Scooters. Crutches. Turn on the lights when you go into a dark area. Replace any light bulbs as soon as they burn out. Set up your furniture so you have a clear path. Avoid moving your furniture around. If any of your floors are uneven, fix them. If there are any pets around you, be aware of where they are. Review your medicines with your doctor. Some medicines can make you feel dizzy. This can increase your chance of falling. Ask your doctor what other things that you can do to help prevent falls. This information is not intended to replace advice given to you by your health care provider. Make sure you discuss any questions you have with your health care provider. Document Released: 12/11/2008 Document Revised: 07/23/2015 Document Reviewed: 03/21/2014 Elsevier Interactive Patient Education  2017 [employer].

## 2021-11-18 DIAGNOSIS — R051 Acute cough: Secondary | ICD-10-CM | POA: Diagnosis not present

## 2021-11-18 DIAGNOSIS — U071 COVID-19: Secondary | ICD-10-CM | POA: Diagnosis not present

## 2021-11-18 DIAGNOSIS — R519 Headache, unspecified: Secondary | ICD-10-CM | POA: Diagnosis not present

## 2021-11-18 DIAGNOSIS — R079 Chest pain, unspecified: Secondary | ICD-10-CM | POA: Diagnosis not present

## 2021-11-18 DIAGNOSIS — R509 Fever, unspecified: Secondary | ICD-10-CM | POA: Diagnosis not present

## 2021-11-19 ENCOUNTER — Other Ambulatory Visit: Payer: Self-pay | Admitting: *Deleted

## 2021-11-19 MED ORDER — LEVOTHYROXINE SODIUM 125 MCG PO TABS: 125.0000 ug | ORAL_TABLET | Freq: Every day | ORAL | 3 refills | Status: DC

## 2021-12-09 DIAGNOSIS — C4441 Basal cell carcinoma of skin of scalp and neck: Secondary | ICD-10-CM | POA: Diagnosis not present

## 2022-02-10 ENCOUNTER — Other Ambulatory Visit: Payer: Self-pay | Admitting: Internal Medicine

## 2022-03-31 DIAGNOSIS — H5203 Hypermetropia, bilateral: Secondary | ICD-10-CM | POA: Diagnosis not present

## 2022-04-19 DIAGNOSIS — L820 Inflamed seborrheic keratosis: Secondary | ICD-10-CM | POA: Diagnosis not present

## 2022-04-19 DIAGNOSIS — L570 Actinic keratosis: Secondary | ICD-10-CM | POA: Diagnosis not present

## 2022-04-19 DIAGNOSIS — Z85828 Personal history of other malignant neoplasm of skin: Secondary | ICD-10-CM | POA: Diagnosis not present

## 2022-04-19 DIAGNOSIS — Z08 Encounter for follow-up examination after completed treatment for malignant neoplasm: Secondary | ICD-10-CM | POA: Diagnosis not present

## 2022-04-19 DIAGNOSIS — L298 Other pruritus: Secondary | ICD-10-CM | POA: Diagnosis not present

## 2022-04-19 DIAGNOSIS — L821 Other seborrheic keratosis: Secondary | ICD-10-CM | POA: Diagnosis not present

## 2022-04-19 DIAGNOSIS — L538 Other specified erythematous conditions: Secondary | ICD-10-CM | POA: Diagnosis not present

## 2022-07-11 ENCOUNTER — Telehealth: Payer: Self-pay | Admitting: Internal Medicine

## 2022-07-11 NOTE — Telephone Encounter (Signed)
Called patient to schedule Medicare Annual Wellness Visit (AWV). Left message for patient to call back and schedule Medicare Annual Wellness Visit (AWV).  Last date of AWV: 10/20/2021  Please schedule an appointment at any time with [facility].  If any questions, please contact me at [phone].  Thank you ,  Randon Goldsmith Care Guide [facility] AWV TEAM Direct Dial: [phone]

## 2022-07-12 ENCOUNTER — Telehealth: Payer: Self-pay | Admitting: Internal Medicine

## 2022-07-12 NOTE — Telephone Encounter (Signed)
Contacted Shane Short to schedule their annual wellness visit. Appointment made for 07/28/2022.  [facility] Care Guide [facility] AWV TEAM Direct Dial: [phone]

## 2022-08-02 ENCOUNTER — Ambulatory Visit (INDEPENDENT_AMBULATORY_CARE_PROVIDER_SITE_OTHER): Payer: Medicare HMO

## 2022-08-02 VITALS — Ht 69.0 in | Wt 200.0 lb

## 2022-08-02 DIAGNOSIS — Z0000 Encounter for general adult medical examination without abnormal findings: Secondary | ICD-10-CM | POA: Diagnosis not present

## 2022-08-02 NOTE — Progress Notes (Addendum)
I connected with  Shane Short on 08/02/22 by a audio enabled telemedicine application and verified that I am speaking with the correct person using two identifiers.  Patient Location: Home  Provider Location: Office/Clinic  I discussed the limitations of evaluation and management by telemedicine. The patient expressed understanding and agreed to proceed.  Subjective:   Shane Short is a 72 y.o. male who presents for Medicare Annual/Subsequent preventive examination.  Review of Systems     Cardiac Risk Factors include: family history of premature cardiovascular disease;advanced age (>86men, >63 women);hypertension;dyslipidemia;male gender;sedentary lifestyle     Objective:    Today's Vitals   08/02/22 0848 08/02/22 0849  Weight: 200 lb (90.7 kg)   Height: 5\' 9"  (1.753 m)   PainSc: 6  6   PainLoc: Knee    Body mass index is 29.53 kg/m.     08/02/2022    8:50 AM 10/20/2021   10:01 AM 10/19/2020    9:54 AM 08/03/2014    7:54 PM 12/13/2013   12:38 PM  Advanced Directives  Does Patient Have a Medical Advance Directive? No No No No No  Would patient like information on creating a medical advance directive? No - Patient declined  No - Patient declined No - patient declined information No - patient declined information    Current Medications (verified) Outpatient Encounter Medications as of 08/02/2022  Medication Sig   aspirin 81 MG EC tablet Take 81 mg by mouth daily after breakfast.     Cholecalciferol (VITAMIN D3) 1000 UNITS tablet Take 1,000 Units by mouth daily.     levothyroxine (EUTHYROX) 125 MCG tablet Take 1 tablet (125 mcg total) by mouth daily before breakfast.   metoprolol succinate (TOPROL-XL) 25 MG 24 hr tablet Take 1 tablet (25 mg total) by mouth daily.   saw palmetto 160 MG capsule 1 po qd   vitamin B-12 (CYANOCOBALAMIN) 1000 MCG tablet Take 1,000 mcg by mouth daily.   No facility-administered encounter medications on file as of 08/02/2022.    Allergies  (verified) Patient has no known allergies.   History: Past Medical History:  Diagnosis Date   Allergy    Dyslipidemia (high LDL; low HDL)    low hdl   Hypertension    OSA on CPAP    Sleep apnea    has a cpap- doesnt wear all the time    Thyroid disease    hypo   Vitamin B12 deficiency 2011   Past Surgical History:  Procedure Laterality Date   COLONOSCOPY     HERNIA REPAIR     ~30 yrs ago    lower venous doppler  05-02-2006   POLYPECTOMY     Family History  Problem Relation Age of Onset   Mental illness Mother        dementia   COPD Father    Lung cancer Father    Stroke Father        brain aneurism   Hypertension Other    Colon cancer Paternal Uncle    Colon polyps Neg Hx    Esophageal cancer Neg Hx    Rectal cancer Neg Hx    Stomach cancer Neg Hx    Social History   Socioeconomic History   Marital status: Married    Spouse name: Not on file   Number of children: Not on file   Years of education: Not on file   Highest education level: Not on file  Occupational History   Not on file  Tobacco  Use   Smoking status: Former   Smokeless tobacco: Never   Tobacco comments:    quit 30 years ago  Vaping Use   Vaping Use: Never used  Substance and Sexual Activity   Alcohol use: No   Drug use: Not Currently    Types: Mescaline   Sexual activity: Yes  Other Topics Concern   Not on file  Social History Narrative   Not on file   Social Determinants of Health   Financial Resource Strain: Low Risk  (08/02/2022)   Overall Financial Resource Strain (CARDIA)    Difficulty of Paying Living Expenses: Not very hard  Food Insecurity: No Food Insecurity (08/02/2022)   Hunger Vital Sign    Worried About Running Out of Food in the Last Year: Never true    Ran Out of Food in the Last Year: Never true  Transportation Needs: No Transportation Needs (08/02/2022)   PRAPARE - Administrator, Civil Service (Medical): No    Lack of Transportation (Non-Medical): No   Physical Activity: Patient Declined (08/02/2022)   Exercise Vital Sign    Days of Exercise per Week: Patient declined    Minutes of Exercise per Session: Patient declined  Stress: No Stress Concern Present (08/02/2022)   [employer] of Occupational Health - Occupational Stress Questionnaire    Feeling of Stress : Only a little  Social Connections: Unknown (08/02/2022)   Social Connection and Isolation Panel [NHANES]    Frequency of Communication with Friends and Family: More than three times a week    Frequency of Social Gatherings with Friends and Family: Once a week    Attends Religious Services: Not on Marketing executive or Organizations: No    Attends Engineer, structural: Not on file    Marital Status: Married    Tobacco Counseling Counseling given: Not Answered Tobacco comments: quit 30 years ago   Clinical Intake:  Pre-visit preparation completed: Yes  Pain : 0-10 Pain Score: 6  Pain Type: Chronic pain Pain Location: Knee Pain Orientation: Left, Right     BMI - recorded: 29.53 Nutritional Status: BMI 25 -29 Overweight Nutritional Risks: None Diabetes: No  How often do you need to have someone help you when you read instructions, pamphlets, or other written materials from your doctor or pharmacy?: 1 - Never What is the last grade level you completed in school?: HSG  Diabetic? No  Interpreter Needed?: No  Information entered by :: Tsering Leaman N. Gabrella Stroh, LPN.   Activities of Daily Living    08/02/2022    8:53 AM 07/26/2022    4:17 PM  In your present state of health, do you have any difficulty performing the following activities:  Hearing? 0 0  Vision? 0 0  Difficulty concentrating or making decisions? 0 0  Walking or climbing stairs? 1 1  Dressing or bathing? 0 0  Doing errands, shopping? 0 0  Preparing Food and eating ? N N  Using the Toilet? N N  In the past six months, have you accidently leaked urine? N N  Do you have problems  with loss of bowel control? N N  Managing your Medications? N N  Managing your Finances? N N  Housekeeping or managing your Housekeeping? N N    Patient Care Team: Plotnikov, Georgina Quint, MD as PCP - General Dunn, Genice Rouge as Consulting Physician (Optometry)  Indicate any recent Medical Services you may have received from other than [facility] providers  in the past year (date may be approximate).     Assessment:   This is a routine wellness examination for Shane Short.  Hearing/Vision screen Hearing Screening - Comments:: Denies hearing difficulties.   Vision Screening - Comments:: Wears rx glasses - up to date with routine eye exams with London Sheer, OD.   Dietary issues and exercise activities discussed: Current Exercise Habits: The patient does not participate in regular exercise at present (works alot in the yard; no gym), Exercise limited by: orthopedic condition(s)   Goals Addressed             This Visit's Progress    My goal for 2024 is to stay active outdoors, watch my weight and maintain my health.        Depression Screen    08/02/2022    8:54 AM 10/20/2021   10:02 AM 10/04/2021    7:56 AM 10/19/2020    9:58 AM 09/30/2020    8:02 AM 09/30/2019    8:00 AM 09/11/2017    8:38 AM  PHQ 2/9 Scores  PHQ - 2 Score 0 0 0 0 0 0 0  PHQ- 9 Score 0  0 0 0      Fall Risk    08/02/2022    8:52 AM 07/26/2022    4:17 PM 10/20/2021   10:02 AM 10/04/2021    7:56 AM 10/19/2020    9:55 AM  Fall Risk   Falls in the past year? 0 0 0 0 0  Number falls in past yr: 0  0 0 0  Injury with Fall? 0  0 0 0  Risk for fall due to : No Fall Risks  Medication side effect  No Fall Risks  Follow up Falls prevention discussed  Falls evaluation completed;Education provided;Falls prevention discussed  Falls evaluation completed    FALL RISK PREVENTION PERTAINING TO THE HOME:  Any stairs in or around the home? No  If so, are there any without handrails? No  Home free of loose throw rugs in walkways, pet beds,  electrical cords, etc? Yes  Adequate lighting in your home to reduce risk of falls? Yes   ASSISTIVE DEVICES UTILIZED TO PREVENT FALLS:  Life alert? No  Use of a cane, walker or w/c? No  Grab bars in the bathroom? No  Shower chair or bench in shower? No  Elevated toilet seat or a handicapped toilet? Yes   TIMED UP AND GO:  Was the test performed? No . Telephonic Visit  Cognitive Function:        08/02/2022    9:02 AM 10/20/2021   10:04 AM  6CIT Screen  What Year? 0 points 0 points  What month? 0 points 0 points  What time? 0 points 0 points  Count back from 20 0 points 0 points  Months in reverse 0 points 0 points  Repeat phrase 0 points 0 points  Total Score 0 points 0 points    Immunizations Immunization History  Administered Date(s) Administered   Influenza Split 11/15/2010, 11/16/2011   Influenza Whole 03/15/2007, 12/23/2008, 11/13/2009   Influenza, High Dose Seasonal PF 12/17/2016   Influenza,inj,Quad PF,6+ Mos 11/13/2012, 12/19/2014, 11/24/2015   Influenza-Unspecified 11/28/2013, 12/10/2021   PFIZER Comirnaty(Gray Top)Covid-19 Tri-Sucrose Vaccine 12/10/2021   PFIZER(Purple Top)SARS-COV-2 Vaccination 04/06/2019, 04/30/2019, 12/19/2019, 06/19/2020   Pfizer Covid-19 Vaccine Bivalent Booster 10yrs & up 12/01/2020   Pneumococcal Conjugate-13 09/08/2014   Pneumococcal Polysaccharide-23 09/02/2013, 09/26/2018   Tdap 08/27/2010, 10/20/2020   Zoster Recombinat (Shingrix) 11/19/2019, 01/31/2020  Zoster, Live 08/29/2012    TDAP status: Up to date  Flu Vaccine status: Up to date  Pneumococcal vaccine status: Up to date  Covid-19 vaccine status: Completed vaccines  Qualifies for Shingles Vaccine? Yes   Zostavax completed Yes   Shingrix Completed?: Yes  Screening Tests Health Maintenance  Topic Date Due   COVID-19 Vaccine (7 - 2023-24 season) 02/04/2022   INFLUENZA VACCINE  09/29/2022   Medicare Annual Wellness (AWV)  08/02/2023   Colonoscopy  12/10/2029    DTaP/Tdap/Td (3 - Td or Tdap) 10/21/2030   Pneumonia Vaccine 74+ Years old  Completed   Hepatitis C Screening  Completed   Zoster Vaccines- Shingrix  Completed   HPV VACCINES  Aged Out    Health Maintenance  Health Maintenance Due  Topic Date Due   COVID-19 Vaccine (7 - 2023-24 season) 02/04/2022    Colorectal cancer screening: Type of screening: Colonoscopy. Completed 12/11/2019. Repeat every 10 years  Lung Cancer Screening: (Low Dose CT Chest recommended if Age 103-80 years, 30 pack-year currently smoking OR have quit w/in 15years.) does not qualify.   Lung Cancer Screening Referral: NO  Additional Screening:  Hepatitis C Screening: does qualify; Completed 09/07/2015  Vision Screening: Recommended annual ophthalmology exams for early detection of glaucoma and other disorders of the eye. Is the patient up to date with their annual eye exam?  Yes  Who is the provider or what is the name of the office in which the patient attends annual eye exams? London Sheer, OD. If pt is not established with a provider, would they like to be referred to a provider to establish care? No .   Dental Screening: Recommended annual dental exams for proper oral hygiene  Community Resource Referral / Chronic Care Management: CRR required this visit?  No   CCM required this visit?  No      Plan:     I have personally reviewed and noted the following in the patient's chart:   Medical and social history Use of alcohol, tobacco or illicit drugs  Current medications and supplements including opioid prescriptions. Patient is not currently taking opioid prescriptions. Functional ability and status Nutritional status Physical activity Advanced directives List of other physicians Hospitalizations, surgeries, and ER visits in previous 12 months Vitals Screenings to include cognitive, depression, and falls Referrals and appointments  In addition, I have reviewed and discussed with patient certain  preventive protocols, quality metrics, and best practice recommendations. A written personalized care plan for preventive services as well as general preventive health recommendations were provided to patient.     Mickeal Needy, LPN   03/08/1476   Nurse Notes: Normal cognitive status assessed by direct observation via telephone conversation by this Nurse Health Advisor. No abnormalities found.   Medical screening examination/treatment/procedure(s) were performed by non-physician practitioner and as supervising physician I was immediately available for consultation/collaboration.  I agree with above. Jacinta Shoe, MD

## 2022-08-02 NOTE — Patient Instructions (Addendum)
Shane Short , Thank you for taking time to come for your Medicare Wellness Visit. I appreciate your ongoing commitment to your health goals. Please review the following plan we discussed and let me know if I can assist you in the future.   These are the goals we discussed:  Goals      My goal for 2024 is to stay active outdoors, watch my weight and maintain my health.        This is a list of the screening recommended for you and due dates:  Health Maintenance  Topic Date Due   COVID-19 Vaccine (7 - 2023-24 season) 02/04/2022   Flu Shot  09/29/2022   Medicare Annual Wellness Visit  08/02/2023   Colon Cancer Screening  12/10/2029   DTaP/Tdap/Td vaccine (3 - Td or Tdap) 10/21/2030   Pneumonia Vaccine  Completed   Hepatitis C Screening  Completed   Zoster (Shingles) Vaccine  Completed   HPV Vaccine  Aged Out    Advanced directives: No  Conditions/risks identified: Yes  Next appointment: Follow up in one year for your annual wellness visit.   Preventive Care 28 Years and Older, Male  Preventive care refers to lifestyle choices and visits with your health care provider that can promote health and wellness. What does preventive care include? A yearly physical exam. This is also called an annual well check. Dental exams once or twice a year. Routine eye exams. Ask your health care provider how often you should have your eyes checked. Personal lifestyle choices, including: Daily care of your teeth and gums. Regular physical activity. Eating a healthy diet. Avoiding tobacco and drug use. Limiting alcohol use. Practicing safe sex. Taking low doses of aspirin every day. Taking vitamin and mineral supplements as recommended by your health care provider. What happens during an annual well check? The services and screenings done by your health care provider during your annual well check will depend on your age, overall health, lifestyle risk factors, and family history of  disease. Counseling  Your health care provider may ask you questions about your: Alcohol use. Tobacco use. Drug use. Emotional well-being. Home and relationship well-being. Sexual activity. Eating habits. History of falls. Memory and ability to understand (cognition). Work and work Astronomer. Screening  You may have the following tests or measurements: Height, weight, and BMI. Blood pressure. Lipid and cholesterol levels. These may be checked every 5 years, or more frequently if you are over 32 years old. Skin check. Lung cancer screening. You may have this screening every year starting at age 36 if you have a 30-pack-year history of smoking and currently smoke or have quit within the past 15 years. Fecal occult blood test (FOBT) of the stool. You may have this test every year starting at age 75. Flexible sigmoidoscopy or colonoscopy. You may have a sigmoidoscopy every 5 years or a colonoscopy every 10 years starting at age 23. Prostate cancer screening. Recommendations will vary depending on your family history and other risks. Hepatitis C blood test. Hepatitis B blood test. Sexually transmitted disease (STD) testing. Diabetes screening. This is done by checking your blood sugar (glucose) after you have not eaten for a while (fasting). You may have this done every 1-3 years. Abdominal aortic aneurysm (AAA) screening. You may need this if you are a current or former smoker. Osteoporosis. You may be screened starting at age 33 if you are at high risk. Talk with your health care provider about your test results, treatment options, and  if necessary, the need for more tests. Vaccines  Your health care provider may recommend certain vaccines, such as: Influenza vaccine. This is recommended every year. Tetanus, diphtheria, and acellular pertussis (Tdap, Td) vaccine. You may need a Td booster every 10 years. Zoster vaccine. You may need this after age 36. Pneumococcal 13-valent  conjugate (PCV13) vaccine. One dose is recommended after age 13. Pneumococcal polysaccharide (PPSV23) vaccine. One dose is recommended after age 62. Talk to your health care provider about which screenings and vaccines you need and how often you need them. This information is not intended to replace advice given to you by your health care provider. Make sure you discuss any questions you have with your health care provider. Document Released: 03/13/2015 Document Revised: 11/04/2015 Document Reviewed: 12/16/2014 Elsevier Interactive Patient Education  2017 [employer].  Fall Prevention in the Home Falls can cause injuries. They can happen to people of all ages. There are many things you can do to make your home safe and to help prevent falls. What can I do on the outside of my home? Regularly fix the edges of walkways and driveways and fix any cracks. Remove anything that might make you trip as you walk through a door, such as a raised step or threshold. Trim any bushes or trees on the path to your home. Use bright outdoor lighting. Clear any walking paths of anything that might make someone trip, such as rocks or tools. Regularly check to see if handrails are loose or broken. Make sure that both sides of any steps have handrails. Any raised decks and porches should have guardrails on the edges. Have any leaves, snow, or ice cleared regularly. Use sand or salt on walking paths during winter. Clean up any spills in your garage right away. This includes oil or grease spills. What can I do in the bathroom? Use night lights. Install grab bars by the toilet and in the tub and shower. Do not use towel bars as grab bars. Use non-skid mats or decals in the tub or shower. If you need to sit down in the shower, use a plastic, non-slip stool. Keep the floor dry. Clean up any water that spills on the floor as soon as it happens. Remove soap buildup in the tub or shower regularly. Attach bath mats  securely with double-sided non-slip rug tape. Do not have throw rugs and other things on the floor that can make you trip. What can I do in the bedroom? Use night lights. Make sure that you have a light by your bed that is easy to reach. Do not use any sheets or blankets that are too big for your bed. They should not hang down onto the floor. Have a firm chair that has side arms. You can use this for support while you get dressed. Do not have throw rugs and other things on the floor that can make you trip. What can I do in the kitchen? Clean up any spills right away. Avoid walking on wet floors. Keep items that you use a lot in easy-to-reach places. If you need to reach something above you, use a strong step stool that has a grab bar. Keep electrical cords out of the way. Do not use floor polish or wax that makes floors slippery. If you must use wax, use non-skid floor wax. Do not have throw rugs and other things on the floor that can make you trip. What can I do with my stairs? Do not leave any  items on the stairs. Make sure that there are handrails on both sides of the stairs and use them. Fix handrails that are broken or loose. Make sure that handrails are as long as the stairways. Check any carpeting to make sure that it is firmly attached to the stairs. Fix any carpet that is loose or worn. Avoid having throw rugs at the top or bottom of the stairs. If you do have throw rugs, attach them to the floor with carpet tape. Make sure that you have a light switch at the top of the stairs and the bottom of the stairs. If you do not have them, ask someone to add them for you. What else can I do to help prevent falls? Wear shoes that: Do not have high heels. Have rubber bottoms. Are comfortable and fit you well. Are closed at the toe. Do not wear sandals. If you use a stepladder: Make sure that it is fully opened. Do not climb a closed stepladder. Make sure that both sides of the stepladder  are locked into place. Ask someone to hold it for you, if possible. Clearly mark and make sure that you can see: Any grab bars or handrails. First and last steps. Where the edge of each step is. Use tools that help you move around (mobility aids) if they are needed. These include: Canes. Walkers. Scooters. Crutches. Turn on the lights when you go into a dark area. Replace any light bulbs as soon as they burn out. Set up your furniture so you have a clear path. Avoid moving your furniture around. If any of your floors are uneven, fix them. If there are any pets around you, be aware of where they are. Review your medicines with your doctor. Some medicines can make you feel dizzy. This can increase your chance of falling. Ask your doctor what other things that you can do to help prevent falls. This information is not intended to replace advice given to you by your health care provider. Make sure you discuss any questions you have with your health care provider. Document Released: 12/11/2008 Document Revised: 07/23/2015 Document Reviewed: 03/21/2014 Elsevier Interactive Patient Education  2017 [employer].

## 2022-09-23 ENCOUNTER — Other Ambulatory Visit: Payer: Self-pay | Admitting: Internal Medicine

## 2022-10-10 ENCOUNTER — Ambulatory Visit (INDEPENDENT_AMBULATORY_CARE_PROVIDER_SITE_OTHER): Payer: Medicare HMO | Admitting: Internal Medicine

## 2022-10-10 ENCOUNTER — Encounter: Payer: Self-pay | Admitting: Internal Medicine

## 2022-10-10 VITALS — BP 122/70 | HR 53 | Temp 98.6°F | Ht 69.0 in | Wt 191.0 lb

## 2022-10-10 DIAGNOSIS — E034 Atrophy of thyroid (acquired): Secondary | ICD-10-CM | POA: Diagnosis not present

## 2022-10-10 DIAGNOSIS — E538 Deficiency of other specified B group vitamins: Secondary | ICD-10-CM | POA: Diagnosis not present

## 2022-10-10 DIAGNOSIS — I10 Essential (primary) hypertension: Secondary | ICD-10-CM | POA: Diagnosis not present

## 2022-10-10 DIAGNOSIS — Z0000 Encounter for general adult medical examination without abnormal findings: Secondary | ICD-10-CM

## 2022-10-10 LAB — COMPREHENSIVE METABOLIC PANEL
ALT: 17 U/L (ref 0–53)
AST: 25 U/L (ref 0–37)
Albumin: 4.5 g/dL (ref 3.5–5.2)
Alkaline Phosphatase: 62 U/L (ref 39–117)
BUN: 25 mg/dL — ABNORMAL HIGH (ref 6–23)
CO2: 28 mEq/L (ref 19–32)
Calcium: 9.6 mg/dL (ref 8.4–10.5)
Chloride: 100 mEq/L (ref 96–112)
Creatinine, Ser: 1.14 mg/dL (ref 0.40–1.50)
GFR: 64.88 mL/min (ref >60.00)
Glucose, Bld: 101 mg/dL — ABNORMAL HIGH (ref 70–99)
Potassium: 3.9 mEq/L (ref 3.5–5.1)
Sodium: 136 mEq/L (ref 135–145)
Total Bilirubin: 0.9 mg/dL (ref 0.2–1.2)
Total Protein: 7.5 g/dL (ref 6.0–8.3)

## 2022-10-10 LAB — URINALYSIS
Bilirubin Urine: NEGATIVE
Hgb urine dipstick: NEGATIVE
Ketones, ur: NEGATIVE
Leukocytes,Ua: NEGATIVE
Nitrite: NEGATIVE
Specific Gravity, Urine: 1.025 (ref 1.000–1.030)
Total Protein, Urine: NEGATIVE
Urine Glucose: NEGATIVE
Urobilinogen, UA: 0.2 (ref 0.0–1.0)
pH: 6 (ref 5.0–8.0)

## 2022-10-10 LAB — CBC WITH DIFFERENTIAL/PLATELET
Basophils Absolute: 0 10*3/uL (ref 0.0–0.1)
Basophils Relative: 0.8 % (ref 0.0–3.0)
Eosinophils Absolute: 0.5 10*3/uL (ref 0.0–0.7)
Eosinophils Relative: 8 % — ABNORMAL HIGH (ref 0.0–5.0)
HCT: 40.9 % (ref 39.0–52.0)
Hemoglobin: 13.7 g/dL (ref 13.0–17.0)
Lymphocytes Relative: 29.4 % (ref 12.0–46.0)
Lymphs Abs: 1.8 10*3/uL (ref 0.7–4.0)
MCHC: 33.4 g/dL (ref 30.0–36.0)
MCV: 86.3 fl (ref 78.0–100.0)
Monocytes Absolute: 0.6 10*3/uL (ref 0.1–1.0)
Monocytes Relative: 9.9 % (ref 3.0–12.0)
Neutro Abs: 3.2 10*3/uL (ref 1.4–7.7)
Neutrophils Relative %: 51.9 % (ref 43.0–77.0)
Platelets: 227 10*3/uL (ref 150.0–400.0)
RBC: 4.74 Mil/uL (ref 4.22–5.81)
RDW: 13.2 % (ref 11.5–15.5)
WBC: 6.1 10*3/uL (ref 4.0–10.5)

## 2022-10-10 LAB — LIPID PANEL
Cholesterol: 202 mg/dL — ABNORMAL HIGH (ref 0–200)
HDL: 40.6 mg/dL (ref >39.00)
LDL Cholesterol: 149 mg/dL — ABNORMAL HIGH (ref 0–99)
NonHDL: 161.44
Total CHOL/HDL Ratio: 5
Triglycerides: 63 mg/dL (ref 0.0–149.0)
VLDL: 12.6 mg/dL (ref 0.0–40.0)

## 2022-10-10 LAB — PSA: PSA: 0.84 ng/mL (ref 0.10–4.00)

## 2022-10-10 LAB — VITAMIN B12: Vitamin B-12: 815 pg/mL (ref 211–911)

## 2022-10-10 LAB — TSH: TSH: 10.73 u[IU]/mL — ABNORMAL HIGH (ref 0.35–5.50)

## 2022-10-10 MED ORDER — LEVOTHYROXINE SODIUM 125 MCG PO TABS: 125.0000 ug | ORAL_TABLET | Freq: Every day | ORAL | 3 refills | Status: DC

## 2022-10-10 MED ORDER — METOPROLOL SUCCINATE ER 25 MG PO TB24: 25.0000 mg | ORAL_TABLET | Freq: Every day | ORAL | 3 refills | Status: DC

## 2022-10-10 NOTE — Assessment & Plan Note (Signed)
On B12 

## 2022-10-10 NOTE — Assessment & Plan Note (Signed)
Chronic On Levothroid - 

## 2022-10-10 NOTE — Progress Notes (Signed)
Subjective:  Patient ID: Shane Short, male    DOB: 11-Sep-1950  Age: 72 y.o. MRN: [id number]  CC: Annual Exam   HPI Shane Short presents for a well exam    Outpatient Medications Prior to Visit  Medication Sig Dispense Refill   aspirin 81 MG EC tablet Take 81 mg by mouth daily after breakfast.       Cholecalciferol (VITAMIN D3) 1000 UNITS tablet Take 1,000 Units by mouth daily.       levothyroxine (EUTHYROX) 125 MCG tablet Take 1 tablet (125 mcg total) by mouth daily before breakfast. 90 tablet 3   metoprolol succinate (TOPROL-XL) 25 MG 24 hr tablet Take 1 tablet by mouth once daily 90 tablet 1   saw palmetto 160 MG capsule 1 po qd 100 capsule 3   vitamin B-12 (CYANOCOBALAMIN) 1000 MCG tablet Take 1,000 mcg by mouth daily.     No facility-administered medications prior to visit.    ROS: Review of Systems  Constitutional:  Negative for appetite change, fatigue and unexpected weight change.  HENT:  Negative for congestion, nosebleeds, sneezing, sore throat and trouble swallowing.   Eyes:  Negative for itching and visual disturbance.  Respiratory:  Negative for cough.   Cardiovascular:  Negative for chest pain, palpitations and leg swelling.  Gastrointestinal:  Negative for abdominal distention, blood in stool, diarrhea and nausea.  Genitourinary:  Negative for frequency and hematuria.  Musculoskeletal:  Negative for back pain, gait problem, joint swelling and neck pain.  Skin:  Negative for rash.  Neurological:  Negative for dizziness, tremors, speech difficulty and weakness.  Psychiatric/Behavioral:  Negative for agitation, dysphoric mood and sleep disturbance. The patient is not nervous/anxious.     Objective:  BP 122/70 (BP Location: Right Arm, Patient Position: Sitting, Cuff Size: Large)   Pulse (!) 53   Temp 98.6 F (37 C) (Oral)   Ht 5\' 9"  (1.753 m)   Wt 191 lb (86.6 kg)   SpO2 97%   BMI 28.21 kg/m   BP Readings from Last 3 Encounters:  10/10/22 122/70   10/04/21 128/72  09/30/20 120/68    Wt Readings from Last 3 Encounters:  10/10/22 191 lb (86.6 kg)  08/02/22 200 lb (90.7 kg)  10/20/21 197 lb (89.4 kg)    Physical Exam Constitutional:      General: He is not in acute distress.    Appearance: He is well-developed.     Comments: NAD  Eyes:     Conjunctiva/sclera: Conjunctivae normal.     Pupils: Pupils are equal, round, and reactive to light.  Neck:     Thyroid: No thyromegaly.     Vascular: No JVD.  Cardiovascular:     Rate and Rhythm: Normal rate and regular rhythm.     Heart sounds: Normal heart sounds. No murmur heard.    No friction rub. No gallop.  Pulmonary:     Effort: Pulmonary effort is normal. No respiratory distress.     Breath sounds: Normal breath sounds. No wheezing or rales.  Chest:     Chest wall: No tenderness.  Abdominal:     General: Bowel sounds are normal. There is no distension.     Palpations: Abdomen is soft. There is no mass.     Tenderness: There is no abdominal tenderness. There is no guarding or rebound.  Genitourinary:    Prostate: Normal.     Rectum: Normal. Guaiac result negative.  Musculoskeletal:        General: No  tenderness. Normal range of motion.     Cervical back: Normal range of motion.  Lymphadenopathy:     Cervical: No cervical adenopathy.  Skin:    General: Skin is warm and dry.     Findings: No rash.  Neurological:     Mental Status: He is alert and oriented to person, place, and time.     Cranial Nerves: No cranial nerve deficit.     Motor: No abnormal muscle tone.     Coordination: Coordination normal.     Gait: Gait normal.     Deep Tendon Reflexes: Reflexes are normal and symmetric.  Psychiatric:        Behavior: Behavior normal.        Thought Content: Thought content normal.        Judgment: Judgment normal.     Lab Results  Component Value Date   WBC 5.2 10/04/2021   HGB 13.8 10/04/2021   HCT 40.5 10/04/2021   PLT 200.0 10/04/2021   GLUCOSE 100 (H)  10/04/2021   CHOL 210 (H) 10/04/2021   TRIG 114.0 10/04/2021   HDL 38.50 (L) 10/04/2021   LDLDIRECT 98.0 09/26/2018   LDLCALC 149 (H) 10/04/2021   ALT 18 10/04/2021   AST 21 10/04/2021   NA 141 10/04/2021   K 4.5 10/04/2021   CL 101 10/04/2021   CREATININE 1.07 10/04/2021   BUN 18 10/04/2021   CO2 28 10/04/2021   TSH 8.13 (H) 10/04/2021   PSA 0.56 10/04/2021    CT Renal Stone Study  Result Date: 11/09/2016 CLINICAL DATA:  Left flank pain. EXAM: CT ABDOMEN AND PELVIS WITHOUT CONTRAST TECHNIQUE: Multidetector CT imaging of the abdomen and pelvis was performed following the standard protocol without IV contrast. COMPARISON:  CT 08/03/2014 FINDINGS: Lower chest: The lung bases are clear. Hepatobiliary: Mild hepatic steatosis, no evidence of focal lesion. Gallbladder physiologically distended, no calcified stone. No biliary dilatation. Pancreas: No ductal dilatation or inflammation. Spleen: Normal in size without focal abnormality. Adrenals/Urinary Tract: Normal adrenal glands. Obstructing 4 x 4 mm stone in the distal left ureter just proximal to the ureterovesicular junction with moderate left hydroureteronephrosis. Moderate left perinephric edema. Multiple, at least 7, nonobstructing stones in the left kidney. No right hydronephrosis or hydroureter. Multiple, at least 8, nonobstructing stones in the right kidney. Urinary bladder is physiologically distended without stone or wall thickening. Stomach/Bowel: Diverticulosis throughout the entire colon without acute diverticulitis. Appendix not confidently visualized. No small bowel dilatation or inflammation. Stomach is nondistended. Vascular/Lymphatic: Mild aortic and branch atherosclerosis without aneurysm. No abdominal or pelvic adenopathy. Reproductive: Prostate is unremarkable. Other: No free air, free fluid, or intra-abdominal fluid collection. Tiny fat containing umbilical hernia. Musculoskeletal: There are no acute or suspicious osseous  abnormalities. IMPRESSION: 1. Obstructing 4 mm stone in the distal left ureter just proximal to the ureterovesicular junction with moderate hydronephrosis and perinephric edema. 2. Multiple additional nonobstructing bilateral renal calculi. 3. Multifocal colonic diverticulosis throughout the entire colon. No acute diverticulitis. 4. Hepatic steatosis. 5.  Aortic Atherosclerosis (ICD10-I70.0). Electronically Signed   By: Rubye Oaks M.D.   On: 11/09/2016 05:42    Assessment & Plan:   Problem List Items Addressed This Visit     Hypothyroidism    Chronic On Levothroid      B12 deficiency - Primary    On B12      Relevant Orders   Vitamin B12   Essential hypertension    Chronic. Reduce Toprol XL to 1/2 tab due to lightheadedness  Well adult exam     We discussed age appropriate health related issues, including available/recomended screening tests and vaccinations. Labs were ordered to be later reviewed . All questions were answered. We discussed one or more of the following - seat belt use, use of sunscreen/sun exposure exercise, fall risk reduction, second hand smoke exposure, firearm use and storage, seat belt use, a need for adhering to healthy diet and exercise. Labs were ordered.  All questions were answered. A cardiac CT scan for coronary calcium offered 7/20, 7/21 Colon 2011, 2021- due in 2031      Relevant Orders   TSH   Urinalysis   CBC with Differential/Platelet   Lipid panel   PSA   Comprehensive metabolic panel   Vitamin B12      No orders of the defined types were placed in this encounter.     Follow-up: No follow-ups on file.  Sonda Primes, MD

## 2022-10-10 NOTE — Patient Instructions (Signed)

## 2022-10-10 NOTE — Assessment & Plan Note (Signed)
Chronic. Reduce Toprol XL to 1/2 tab due to lightheadedness

## 2022-10-10 NOTE — Assessment & Plan Note (Signed)
  We discussed age appropriate health related issues, including available/recomended screening tests and vaccinations. Labs were ordered to be later reviewed . All questions were answered. We discussed one or more of the following - seat belt use, use of sunscreen/sun exposure exercise, fall risk reduction, second hand smoke exposure, firearm use and storage, seat belt use, a need for adhering to healthy diet and exercise. Labs were ordered.  All questions were answered. A cardiac CT scan for coronary calcium offered 7/20, 7/21 Colon 2011, 2021- due in 2031

## 2022-10-12 ENCOUNTER — Other Ambulatory Visit (INDEPENDENT_AMBULATORY_CARE_PROVIDER_SITE_OTHER): Payer: Medicare HMO

## 2022-10-12 DIAGNOSIS — E034 Atrophy of thyroid (acquired): Secondary | ICD-10-CM | POA: Diagnosis not present

## 2022-10-12 LAB — T4, FREE: Free T4: 0.92 ng/dL (ref 0.60–1.60)

## 2022-10-21 DIAGNOSIS — L821 Other seborrheic keratosis: Secondary | ICD-10-CM | POA: Diagnosis not present

## 2022-10-21 DIAGNOSIS — Z08 Encounter for follow-up examination after completed treatment for malignant neoplasm: Secondary | ICD-10-CM | POA: Diagnosis not present

## 2022-10-21 DIAGNOSIS — L570 Actinic keratosis: Secondary | ICD-10-CM | POA: Diagnosis not present

## 2022-10-21 DIAGNOSIS — D225 Melanocytic nevi of trunk: Secondary | ICD-10-CM | POA: Diagnosis not present

## 2022-10-21 DIAGNOSIS — L814 Other melanin hyperpigmentation: Secondary | ICD-10-CM | POA: Diagnosis not present

## 2022-10-21 DIAGNOSIS — S30860A Insect bite (nonvenomous) of lower back and pelvis, initial encounter: Secondary | ICD-10-CM | POA: Diagnosis not present

## 2022-10-21 DIAGNOSIS — Z85828 Personal history of other malignant neoplasm of skin: Secondary | ICD-10-CM | POA: Diagnosis not present

## 2023-04-26 ENCOUNTER — Ambulatory Visit: Payer: Medicare Other | Admitting: Orthopaedic Surgery

## 2023-04-26 ENCOUNTER — Other Ambulatory Visit (INDEPENDENT_AMBULATORY_CARE_PROVIDER_SITE_OTHER): Payer: Medicare Other

## 2023-04-26 ENCOUNTER — Telehealth: Payer: Self-pay | Admitting: Orthopaedic Surgery

## 2023-04-26 DIAGNOSIS — M170 Bilateral primary osteoarthritis of knee: Secondary | ICD-10-CM

## 2023-04-26 DIAGNOSIS — M1712 Unilateral primary osteoarthritis, left knee: Secondary | ICD-10-CM | POA: Diagnosis not present

## 2023-04-26 DIAGNOSIS — M1711 Unilateral primary osteoarthritis, right knee: Secondary | ICD-10-CM | POA: Diagnosis not present

## 2023-04-26 MED ORDER — LIDOCAINE HCL 1 % IJ SOLN
2.0000 mL | INTRAMUSCULAR | Status: AC | PRN
  Administered 2023-04-26: 2 mL

## 2023-04-26 MED ORDER — METHYLPREDNISOLONE ACETATE 40 MG/ML IJ SUSP
40.0000 mg | INTRAMUSCULAR | Status: AC | PRN
  Administered 2023-04-26: 40 mg via INTRA_ARTICULAR

## 2023-04-26 MED ORDER — BUPIVACAINE HCL 0.5 % IJ SOLN
2.0000 mL | INTRAMUSCULAR | Status: AC | PRN
  Administered 2023-04-26: 2 mL via INTRA_ARTICULAR

## 2023-04-26 MED ORDER — METHYLPREDNISOLONE ACETATE 40 MG/ML IJ SUSP
40.0000 mg | INTRAMUSCULAR | Status: AC | PRN
Start: 2023-04-26 — End: 2023-04-26
  Administered 2023-04-26: 40 mg via INTRA_ARTICULAR

## 2023-04-26 MED ORDER — LIDOCAINE HCL 1 % IJ SOLN
2.0000 mL | INTRAMUSCULAR | Status: AC | PRN
Start: 2023-04-26 — End: 2023-04-26
  Administered 2023-04-26: 2 mL

## 2023-04-26 MED ORDER — BUPIVACAINE HCL 0.5 % IJ SOLN
2.0000 mL | INTRAMUSCULAR | Status: AC | PRN
Start: 2023-04-26 — End: 2023-04-26
  Administered 2023-04-26: 2 mL via INTRA_ARTICULAR

## 2023-04-26 NOTE — Telephone Encounter (Signed)
 Patient called asked if Dr. Roda Shutters could tell what the lump on his left knee is after looking at the X-rays?  The number to contact patient is [phone]

## 2023-04-26 NOTE — Progress Notes (Signed)
 Office Visit Note   Patient: Shane Short           Date of Birth: 1950-08-06           MRN: [id number] Visit Date: 04/26/2023              Requested by: Tresa Garter, MD [address] [place],  [place] [address] PCP: Plotnikov, Georgina Quint, MD   Assessment & Plan: Visit Diagnoses:  1. Bilateral primary osteoarthritis of knee     Plan: Impression is bilateral knee osteoarthritis.  Today, we discussed various treatment options to include intra-articular cortisone injections for which he would like to proceed.  He will follow-up with [place] as needed.  Follow-Up Instructions: Return if symptoms worsen or fail to improve.   Orders:  Orders Placed This Encounter  Procedures   Large Joint Inj   XR KNEE 3 VIEW LEFT   XR KNEE 3 VIEW RIGHT   No orders of the defined types were placed in this encounter.     Procedures: Large Joint Inj: bilateral knee on 04/26/2023 1:00 PM Indications: pain Details: 22 G needle  Arthrogram: No  Medications (Right): 2 mL lidocaine 1 %; 2 mL bupivacaine 0.5 %; 40 mg methylPREDNISolone acetate 40 MG/ML Medications (Left): 2 mL lidocaine 1 %; 2 mL bupivacaine 0.5 %; 40 mg methylPREDNISolone acetate 40 MG/ML Outcome: tolerated well, no immediate complications Patient was prepped and draped in the usual sterile fashion.       Clinical Data: No additional findings.   Subjective: Chief Complaint  Patient presents with   Left Knee - Pain   Right Knee - Pain    HPI patient is a pleasant 73 year old gentleman who comes in today with bilateral knee pain left greater than right.  Symptoms have been ongoing for years and have progressively worsened.  He notes remote injury to the left knee when he was 73 years old.  He is having constant pain to the entire aspect of both knees.  Symptoms are worse at night.  He has associated stiffness.  He has tried over-the-counter pain medication without relief.  No previous cortisone injection to the  knee.  Review of Systems as detailed in HPI.  All others reviewed and are negative.   Objective: Vital Signs: There were no vitals taken for this visit.  Physical Exam well-developed well-nourished gentleman in no acute distress.  Alert and oriented x 3.  Ortho Exam bilateral knee exam: No effusion.  Range of motion 0 to 120 degrees.  Medial and lateral joint line tenderness.  Stable valgus varus stress.  Neurovascular intact distally.  Specialty Comments:  No specialty comments available.  Imaging: XR KNEE 3 VIEW RIGHT Result Date: 04/26/2023 X-rays demonstrate advanced medial and patellofemoral degenerative changes  XR KNEE 3 VIEW LEFT Result Date: 04/26/2023 X-rays demonstrate advanced medial and patellofemoral degenerative changes    PMFS History: Patient Active Problem List   Diagnosis Date Noted   Atherosclerosis of aorta (HCC) 09/30/2020   Shingles 09/08/2016   Otitis media 09/08/2015   Bradycardia 09/09/2014   Kidney stones 09/09/2014   Acute sinusitis 09/02/2013   Left arm pain 09/02/2013   Dyslipidemia 09/02/2013   CAP (community acquired pneumonia) 03/20/2013   Well adult exam 08/27/2010   Actinic keratosis 02/26/2010   B12 deficiency 11/13/2009   SHOULDER PAIN 11/13/2009   PARESTHESIA 11/13/2009   Hypothyroidism 03/15/2007   OBSTRUCTIVE SLEEP APNEA 12/16/2006   Essential hypertension 12/16/2006   Past Medical History:  Diagnosis  Date   Allergy    Dyslipidemia (high LDL; low HDL)    low hdl   Hypertension    OSA on CPAP    Sleep apnea    has a cpap- doesnt wear all the time    Thyroid disease    hypo   Vitamin B12 deficiency 2011    Family History  Problem Relation Age of Onset   Mental illness Mother        dementia   COPD Father    Lung cancer Father    Stroke Father        brain aneurism   Hypertension Other    Colon cancer Paternal Uncle    Colon polyps Neg Hx    Esophageal cancer Neg Hx    Rectal cancer Neg Hx    Stomach cancer  Neg Hx     Past Surgical History:  Procedure Laterality Date   COLONOSCOPY     HERNIA REPAIR     ~30 yrs ago    lower venous doppler  05-02-2006   POLYPECTOMY     Social History   Occupational History   Not on file  Tobacco Use   Smoking status: Former   Smokeless tobacco: Never   Tobacco comments:    quit 30 years ago  Vaping Use   Vaping status: Never Used  Substance and Sexual Activity   Alcohol use: No   Drug use: Not Currently    Types: Mescaline   Sexual activity: Yes

## 2023-04-27 NOTE — Telephone Encounter (Signed)
 No answer    Left voicemail

## 2023-05-09 ENCOUNTER — Telehealth: Payer: Self-pay | Admitting: Internal Medicine

## 2023-05-09 NOTE — Telephone Encounter (Signed)
 Copied from CRM [id number]. Topic: Clinical - Medication Question >> May 09, 2023  9:33 AM Fredrich Romans wrote: Reason for CRM: levothyroxine (EUTHYROX) 125 MCG tablet manufacturer change  from [employer] to [employer] Will provider will approve cahnge?  [facility] [address] [place], [place] - [address]  Phone: [phone] Fax: [phone]

## 2023-05-10 NOTE — Telephone Encounter (Signed)
 Okay. Thank you.

## 2023-08-03 ENCOUNTER — Ambulatory Visit: Payer: Medicare HMO

## 2023-08-03 VITALS — Ht 69.0 in | Wt 191.0 lb

## 2023-08-03 DIAGNOSIS — Z0000 Encounter for general adult medical examination without abnormal findings: Secondary | ICD-10-CM

## 2023-08-03 NOTE — Patient Instructions (Signed)
 Shane Short , Thank you for taking time out of your busy schedule to complete your Annual Wellness Visit with me. I enjoyed our conversation and look forward to speaking with you again next year. I, as well as your care team,  appreciate your ongoing commitment to your health goals. Please review the following plan we discussed and let me know if I can assist you in the future. Your Game plan/ To Do List   Follow up Visits: Next Medicare AWV with our clinical staff: 08/08/2024.   Have you seen your provider in the last 6 months (3 months if uncontrolled diabetes)? Yes Next Office Visit with your provider: 10/10/2023.  Clinician Recommendations:  Aim for 30 minutes of exercise or brisk walking, 6-8 glasses of water, and 5 servings of fruits and vegetables each day. Keep up the good work.      This is a list of the screening recommended for you and due dates:  Health Maintenance  Topic Date Due   COVID-19 Vaccine (7 - 2024-25 season) 10/30/2022   Flu Shot  09/29/2023   Medicare Annual Wellness Visit  08/02/2024   Colon Cancer Screening  12/10/2029   DTaP/Tdap/Td vaccine (3 - Td or Tdap) 10/21/2030   Pneumonia Vaccine  Completed   Hepatitis C Screening  Completed   Zoster (Shingles) Vaccine  Completed   HPV Vaccine  Aged Out   Meningitis B Vaccine  Aged Out    Advanced directives: (Declined) Advance directive discussed with you today. Even though you declined this today, please call our office should you change your mind, and we can give you the proper paperwork for you to fill out. Advance Care Planning is important because it:  [x]  Makes sure you receive the medical care that is consistent with your values, goals, and preferences  [x]  It provides guidance to your family and loved ones and reduces their decisional burden about whether or not they are making the right decisions based on your wishes.  Follow the link provided in your after visit summary or read over the paperwork we have  mailed to you to help you started getting your Advance Directives in place. If you need assistance in completing these, please reach out to us  so that we can help you!  See attachments for Preventive Care and Fall Prevention Tips.

## 2023-08-03 NOTE — Progress Notes (Cosign Needed Addendum)
 Subjective:   Shane Short is a 73 y.o. who presents for a Medicare Wellness preventive visit.  As a reminder, Annual Wellness Visits don't include a physical exam, and some assessments may be limited, especially if this visit is performed virtually. We may recommend an in-person follow-up visit with your provider if needed.  Visit Complete: Virtual I connected with  Lorrane Rosette on 08/03/23 by a audio enabled telemedicine application and verified that I am speaking with the correct person using two identifiers.  Patient Location: Home  Provider Location: Office/Clinic  I discussed the limitations of evaluation and management by telemedicine. The patient expressed understanding and agreed to proceed.  Vital Signs: Because this visit was a virtual/telehealth visit, some criteria may be missing or patient reported. Any vitals not documented were not able to be obtained and vitals that have been documented are patient reported.  VideoDeclined- This patient declined Librarian, academic. Therefore the visit was completed with audio only.  Persons Participating in Visit: Patient.  AWV Questionnaire: No: Patient Medicare AWV questionnaire was not completed prior to this visit.  Cardiac Risk Factors include: advanced age (>35men, >79 women);hypertension;dyslipidemia;male gender;diabetes mellitus;Other (see comment), Risk factor comments: OSA, CAP, Atherosclerosis of aorta     Objective:     Today's Vitals   08/03/23 0852  Weight: 191 lb (86.6 kg)  Height: 5\' 9"  (1.753 m)   Body mass index is 28.21 kg/m.     08/03/2023    9:04 AM 08/02/2022    8:50 AM 10/20/2021   10:01 AM 10/19/2020    9:54 AM 08/03/2014    7:54 PM 12/13/2013   12:38 PM  Advanced Directives  Does Patient Have a Medical Advance Directive? No No No No No No  Would patient like information on creating a medical advance directive?  No - Patient declined  No - Patient declined No - patient  declined information No - patient declined information    Current Medications (verified) Outpatient Encounter Medications as of 08/03/2023  Medication Sig   aspirin  81 MG EC tablet Take 81 mg by mouth daily after breakfast.     Cholecalciferol (VITAMIN D3) 1000 UNITS tablet Take 1,000 Units by mouth daily.     levothyroxine  (EUTHYROX ) 125 MCG tablet Take 1 tablet (125 mcg total) by mouth daily before breakfast.   metoprolol  succinate (TOPROL -XL) 25 MG 24 hr tablet Take 1 tablet (25 mg total) by mouth daily.   saw palmetto  160 MG capsule 1 po qd   vitamin B-12 (CYANOCOBALAMIN ) 1000 MCG tablet Take 1,000 mcg by mouth daily.   No facility-administered encounter medications on file as of 08/03/2023.    Allergies (verified) Patient has no known allergies.   History: Past Medical History:  Diagnosis Date   Allergy    Dyslipidemia (high LDL; low HDL)    low hdl   Hypertension    OSA on CPAP    Sleep apnea    has a cpap- doesnt wear all the time    Thyroid  disease    hypo   Vitamin B12 deficiency 2011   Past Surgical History:  Procedure Laterality Date   COLONOSCOPY     HERNIA REPAIR     ~30 yrs ago    lower venous doppler  05-02-2006   POLYPECTOMY     Family History  Problem Relation Age of Onset   Mental illness Mother        dementia   COPD Father    Lung cancer  Father    Stroke Father        brain aneurism   Hypertension Other    Colon cancer Paternal Uncle    Colon polyps Neg Hx    Esophageal cancer Neg Hx    Rectal cancer Neg Hx    Stomach cancer Neg Hx    Social History   Socioeconomic History   Marital status: Married    Spouse name: Vickie   Number of children: 4   Years of education: Not on file   Highest education level: Not on file  Occupational History   Occupation: RETIRED  Tobacco Use   Smoking status: Former   Smokeless tobacco: Never   Tobacco comments:    quit 30 years ago  Vaping Use   Vaping status: Never Used  Substance and Sexual  Activity   Alcohol use: No   Drug use: Not Currently    Types: Mescaline   Sexual activity: Yes  Other Topics Concern   Not on file  Social History Narrative   Lives with wife and son/2025   Social Drivers of Health   Financial Resource Strain: Low Risk  (08/03/2023)   Overall Financial Resource Strain (CARDIA)    Difficulty of Paying Living Expenses: Not hard at all  Food Insecurity: No Food Insecurity (08/03/2023)   Hunger Vital Sign    Worried About Running Out of Food in the Last Year: Never true    Ran Out of Food in the Last Year: Never true  Transportation Needs: No Transportation Needs (08/03/2023)   PRAPARE - Administrator, Civil Service (Medical): No    Lack of Transportation (Non-Medical): No  Physical Activity: Sufficiently Active (08/03/2023)   Exercise Vital Sign    Days of Exercise per Week: 7 days    Minutes of Exercise per Session: 30 min  Stress: No Stress Concern Present (08/03/2023)   [employer] of Occupational Health - Occupational Stress Questionnaire    Feeling of Stress : Not at all  Social Connections: Moderately Isolated (08/03/2023)   Social Connection and Isolation Panel [NHANES]    Frequency of Communication with Friends and Family: More than three times a week    Frequency of Social Gatherings with Friends and Family: Once a week    Attends Religious Services: Never    Database administrator or Organizations: No    Attends Engineer, structural: Never    Marital Status: Married    Tobacco Counseling Counseling given: Not Answered Tobacco comments: quit 30 years ago    Clinical Intake:  Pre-visit preparation completed: Yes  Pain : No/denies pain     BMI - recorded: 28.21 Nutritional Status: BMI 25 -29 Overweight Nutritional Risks: None Diabetes: No  No results found for: "HGBA1C"   How often do you need to have someone help you when you read instructions, pamphlets, or other written materials from your doctor or  pharmacy?: 1 - Never     Information entered by :: Jhaniya Briski, RMA   Activities of Daily Living     08/03/2023    8:53 AM  In your present state of health, do you have any difficulty performing the following activities:  Hearing? 0  Vision? 0  Difficulty concentrating or making decisions? 0  Walking or climbing stairs? 0  Dressing or bathing? 0  Doing errands, shopping? 0  Preparing Food and eating ? N  Using the Toilet? N  In the past six months, have you accidently leaked urine?  N  Do you have problems with loss of bowel control? N  Managing your Medications? N  Managing your Finances? N  Housekeeping or managing your Housekeeping? N    Patient Care Team: Plotnikov, Oakley Bellman, MD as PCP - General Dunn, Peter K as Consulting Physician (Optometry)  I have updated your Care Teams any recent Medical Services you may have received from other providers in the past year.     Assessment:    This is a routine wellness examination for Shane Short.  Hearing/Vision screen Vision Screening - Comments:: Wears eyeglasses/Dr. Jewelene Morton   Goals Addressed             This Visit's Progress    My goal for 2024 is to stay active outdoors, watch my weight and maintain my health.   On track    Maintaining/2025       Depression Screen     08/03/2023    9:09 AM 10/10/2022    7:53 AM 08/02/2022    8:54 AM 10/20/2021   10:02 AM 10/04/2021    7:56 AM 10/19/2020    9:58 AM 09/30/2020    8:02 AM  PHQ 2/9 Scores  PHQ - 2 Score 0 0 0 0 0 0 0  PHQ- 9 Score 0  0  0 0 0    Fall Risk     08/03/2023    9:04 AM 10/10/2022    7:53 AM 08/02/2022    8:52 AM 07/26/2022    4:17 PM 10/20/2021   10:02 AM  Fall Risk   Falls in the past year? 0 0 0 0 0  Number falls in past yr: 0 0 0  0  Injury with Fall? 0 0 0  0  Risk for fall due to :  No Fall Risks No Fall Risks  Medication side effect  Follow up Falls evaluation completed;Falls prevention discussed Falls evaluation completed Falls prevention discussed   Falls evaluation completed;Education provided;Falls prevention discussed    MEDICARE RISK AT HOME:  Medicare Risk at Home Any stairs in or around the home?: No If so, are there any without handrails?: No Home free of loose throw rugs in walkways, pet beds, electrical cords, etc?: Yes Adequate lighting in your home to reduce risk of falls?: Yes Life alert?: No Use of a cane, walker or w/c?: Yes (uses a cane sometimes when back is hurting) Grab bars in the bathroom?: Yes Shower chair or bench in shower?: Yes Elevated toilet seat or a handicapped toilet?: Yes  TIMED UP AND GO:  Was the test performed?  No  Cognitive Function: Declined/Normal: No cognitive concerns noted by patient or family. Patient alert, oriented, able to answer questions appropriately and recall recent events. No signs of memory loss or confusion.        08/02/2022    9:02 AM 10/20/2021   10:04 AM  6CIT Screen  What Year? 0 points 0 points  What month? 0 points 0 points  What time? 0 points 0 points  Count back from 20 0 points 0 points  Months in reverse 0 points 0 points  Repeat phrase 0 points 0 points  Total Score 0 points 0 points    Immunizations Immunization History  Administered Date(s) Administered   Influenza Split 11/15/2010, 11/16/2011   Influenza Whole 03/15/2007, 12/23/2008, 11/13/2009   Influenza, High Dose Seasonal PF 12/17/2016   Influenza,inj,Quad PF,6+ Mos 11/13/2012, 12/19/2014, 11/24/2015   Influenza-Unspecified 11/28/2013, 12/10/2021   PFIZER Comirnaty(Gray Top)Covid-19 Tri-Sucrose Vaccine 12/10/2021  PFIZER(Purple Top)SARS-COV-2 Vaccination 04/06/2019, 04/30/2019, 12/19/2019, 06/19/2020   Pfizer Covid-19 Vaccine Bivalent Booster 53yrs & up 12/01/2020   Pneumococcal Conjugate-13 09/08/2014   Pneumococcal Polysaccharide-23 09/02/2013, 09/26/2018   Tdap 08/27/2010, 10/20/2020   Zoster Recombinant(Shingrix) 11/19/2019, 01/31/2020   Zoster, Live 08/29/2012    Screening  Tests Health Maintenance  Topic Date Due   COVID-19 Vaccine (7 - 2024-25 season) 10/30/2022   INFLUENZA VACCINE  09/29/2023   Medicare Annual Wellness (AWV)  08/02/2024   Colonoscopy  12/10/2029   DTaP/Tdap/Td (3 - Td or Tdap) 10/21/2030   Pneumonia Vaccine 47+ Years old  Completed   Hepatitis C Screening  Completed   Zoster Vaccines- Shingrix  Completed   HPV VACCINES  Aged Out   Meningococcal B Vaccine  Aged Out    Health Maintenance  Health Maintenance Due  Topic Date Due   COVID-19 Vaccine (7 - 2024-25 season) 10/30/2022   Health Maintenance Items Addressed: See Nurse Notes at the end of this note  Additional Screening:  Vision Screening: Recommended annual ophthalmology exams for early detection of glaucoma and other disorders of the eye. Would you like a referral to an eye doctor? No    Dental Screening: Recommended annual dental exams for proper oral hygiene  Community Resource Referral / Chronic Care Management: CRR required this visit?  No   CCM required this visit?  No   Plan:    I have personally reviewed and noted the following in the patient's chart:   Medical and social history Use of alcohol, tobacco or illicit drugs  Current medications and supplements including opioid prescriptions. Patient is not currently taking opioid prescriptions. Functional ability and status Nutritional status Physical activity Advanced directives List of other physicians Hospitalizations, surgeries, and ER visits in previous 12 months Vitals Screenings to include cognitive, depression, and falls Referrals and appointments  In addition, I have reviewed and discussed with patient certain preventive protocols, quality metrics, and best practice recommendations. A written personalized care plan for preventive services as well as general preventive health recommendations were provided to patient.   [place] Severs L Christine Morton, CMA   08/03/2023   After Visit Summary: (MyChart) Due to  this being a telephonic visit, the after visit summary with patients personalized plan was offered to patient via MyChart   Notes: Nothing significant to report at this time.  Medical screening examination/treatment/procedure(s) were performed by non-physician practitioner and as supervising physician I was immediately available for consultation/collaboration.  I agree with above. Adelaide Holy, MD

## 2023-10-10 ENCOUNTER — Ambulatory Visit: Payer: Medicare HMO | Admitting: Internal Medicine

## 2023-10-17 ENCOUNTER — Ambulatory Visit (INDEPENDENT_AMBULATORY_CARE_PROVIDER_SITE_OTHER): Admitting: Internal Medicine

## 2023-10-17 ENCOUNTER — Encounter: Payer: Self-pay | Admitting: Internal Medicine

## 2023-10-17 VITALS — HR 45 | Temp 98.0°F | Ht 69.0 in | Wt 193.0 lb

## 2023-10-17 DIAGNOSIS — R001 Bradycardia, unspecified: Secondary | ICD-10-CM | POA: Diagnosis not present

## 2023-10-17 DIAGNOSIS — E538 Deficiency of other specified B group vitamins: Secondary | ICD-10-CM | POA: Diagnosis not present

## 2023-10-17 DIAGNOSIS — I10 Essential (primary) hypertension: Secondary | ICD-10-CM

## 2023-10-17 DIAGNOSIS — Z0000 Encounter for general adult medical examination without abnormal findings: Secondary | ICD-10-CM | POA: Diagnosis not present

## 2023-10-17 DIAGNOSIS — E034 Atrophy of thyroid (acquired): Secondary | ICD-10-CM | POA: Diagnosis not present

## 2023-10-17 LAB — CBC WITH DIFFERENTIAL/PLATELET
Basophils Absolute: 0 K/uL (ref 0.0–0.1)
Basophils Relative: 0.7 % (ref 0.0–3.0)
Eosinophils Absolute: 0.3 K/uL (ref 0.0–0.7)
Eosinophils Relative: 6.2 % — ABNORMAL HIGH (ref 0.0–5.0)
HCT: 40.1 % (ref 39.0–52.0)
Hemoglobin: 13.3 g/dL (ref 13.0–17.0)
Lymphocytes Relative: 33.4 % (ref 12.0–46.0)
Lymphs Abs: 1.8 K/uL (ref 0.7–4.0)
MCHC: 33.3 g/dL (ref 30.0–36.0)
MCV: 85.1 fl (ref 78.0–100.0)
Monocytes Absolute: 0.5 K/uL (ref 0.1–1.0)
Monocytes Relative: 8.7 % (ref 3.0–12.0)
Neutro Abs: 2.8 K/uL (ref 1.4–7.7)
Neutrophils Relative %: 51 % (ref 43.0–77.0)
Platelets: 206 K/uL (ref 150.0–400.0)
RBC: 4.72 Mil/uL (ref 4.22–5.81)
RDW: 12.7 % (ref 11.5–15.5)
WBC: 5.4 K/uL (ref 4.0–10.5)

## 2023-10-17 LAB — COMPREHENSIVE METABOLIC PANEL WITH GFR
ALT: 13 U/L (ref 0–53)
AST: 16 U/L (ref 0–37)
Albumin: 4.1 g/dL (ref 3.5–5.2)
Alkaline Phosphatase: 59 U/L (ref 39–117)
BUN: 18 mg/dL (ref 6–23)
CO2: 29 meq/L (ref 19–32)
Calcium: 9 mg/dL (ref 8.4–10.5)
Chloride: 103 meq/L (ref 96–112)
Creatinine, Ser: 0.83 mg/dL (ref 0.40–1.50)
GFR: 87.67 mL/min (ref >60.00)
Glucose, Bld: 98 mg/dL (ref 70–99)
Potassium: 4.2 meq/L (ref 3.5–5.1)
Sodium: 139 meq/L (ref 135–145)
Total Bilirubin: 0.5 mg/dL (ref 0.2–1.2)
Total Protein: 6.8 g/dL (ref 6.0–8.3)

## 2023-10-17 LAB — URINALYSIS
Bilirubin Urine: NEGATIVE
Hgb urine dipstick: NEGATIVE
Ketones, ur: NEGATIVE
Leukocytes,Ua: NEGATIVE
Nitrite: NEGATIVE
Specific Gravity, Urine: 1.015 (ref 1.000–1.030)
Total Protein, Urine: NEGATIVE
Urine Glucose: NEGATIVE
Urobilinogen, UA: 0.2 (ref 0.0–1.0)
pH: 6 (ref 5.0–8.0)

## 2023-10-17 LAB — LIPID PANEL
Cholesterol: 162 mg/dL (ref 0–200)
HDL: 34.2 mg/dL — ABNORMAL LOW (ref >39.00)
LDL Cholesterol: 91 mg/dL (ref 0–99)
NonHDL: 127.52
Total CHOL/HDL Ratio: 5
Triglycerides: 182 mg/dL — ABNORMAL HIGH (ref 0.0–149.0)
VLDL: 36.4 mg/dL (ref 0.0–40.0)

## 2023-10-17 LAB — VITAMIN B12: Vitamin B-12: 556 pg/mL (ref 211–911)

## 2023-10-17 LAB — PSA: PSA: 0.92 ng/mL (ref 0.10–4.00)

## 2023-10-17 LAB — TSH: TSH: 3.9 u[IU]/mL (ref 0.35–5.50)

## 2023-10-17 MED ORDER — METOPROLOL SUCCINATE ER 25 MG PO TB24: 12.5000 mg | ORAL_TABLET | Freq: Every day | ORAL | Status: AC

## 2023-10-17 MED ORDER — LEVOTHYROXINE SODIUM 125 MCG PO TABS: 125.0000 ug | ORAL_TABLET | Freq: Every day | ORAL | 3 refills | Status: AC

## 2023-10-17 MED ORDER — METOPROLOL SUCCINATE ER 25 MG PO TB24: 25.0000 mg | ORAL_TABLET | Freq: Every day | ORAL | 3 refills | Status: DC

## 2023-10-17 NOTE — Assessment & Plan Note (Signed)
 Chronic On Levothroid -

## 2023-10-17 NOTE — Progress Notes (Signed)
 Subjective:  Patient ID: Shane Short, male    DOB: [birth year]  Age: 73 y.o. MRN: [id number]  CC: Medical Management of Chronic Issues   HPI Shane Short presents for a well exam  Outpatient Medications Prior to Visit  Medication Sig Dispense Refill   aspirin  81 MG EC tablet Take 81 mg by mouth daily after breakfast.       Cholecalciferol (VITAMIN D3) 1000 UNITS tablet Take 1,000 Units by mouth daily.       saw palmetto  160 MG capsule 1 po qd 100 capsule 3   vitamin B-12 (CYANOCOBALAMIN ) 1000 MCG tablet Take 1,000 mcg by mouth daily.     levothyroxine  (EUTHYROX ) 125 MCG tablet Take 1 tablet (125 mcg total) by mouth daily before breakfast. 90 tablet 3   metoprolol  succinate (TOPROL -XL) 25 MG 24 hr tablet Take 1 tablet (25 mg total) by mouth daily. 90 tablet 3   No facility-administered medications prior to visit.    ROS: Review of Systems  Constitutional:  Negative for appetite change, fatigue and unexpected weight change.  HENT:  Negative for congestion, nosebleeds, sneezing, sore throat and trouble swallowing.   Eyes:  Negative for itching and visual disturbance.  Respiratory:  Negative for cough.   Cardiovascular:  Negative for chest pain, palpitations and leg swelling.  Gastrointestinal:  Negative for abdominal distention, blood in stool, diarrhea and nausea.  Genitourinary:  Negative for frequency and hematuria.  Musculoskeletal:  Negative for back pain, gait problem, joint swelling and neck pain.  Skin:  Negative for rash.  Neurological:  Negative for dizziness, tremors, speech difficulty, weakness and light-headedness.  Psychiatric/Behavioral:  Negative for agitation, dysphoric mood and sleep disturbance. The patient is not nervous/anxious.     Objective:  Pulse (!) 45   Temp 98 F (36.7 C) (Oral)   Ht 5' 9 (1.753 m)   Wt 193 lb (87.5 kg)   SpO2 99%   BMI 28.50 kg/m   BP Readings from Last 3 Encounters:  10/10/22 122/70  10/04/21 128/72  09/30/20 120/68     Wt Readings from Last 3 Encounters:  10/17/23 193 lb (87.5 kg)  08/03/23 191 lb (86.6 kg)  10/10/22 191 lb (86.6 kg)    Physical Exam Constitutional:      General: He is not in acute distress.    Appearance: He is well-developed. He is obese.     Comments: NAD  Eyes:     Conjunctiva/sclera: Conjunctivae normal.     Pupils: Pupils are equal, round, and reactive to light.  Neck:     Thyroid : No thyromegaly.     Vascular: No JVD.  Cardiovascular:     Rate and Rhythm: Regular rhythm. Bradycardia present.     Heart sounds: Normal heart sounds. No murmur heard.    No friction rub. No gallop.  Pulmonary:     Effort: Pulmonary effort is normal. No respiratory distress.     Breath sounds: Normal breath sounds. No wheezing or rales.  Chest:     Chest wall: No tenderness.  Abdominal:     General: Bowel sounds are normal. There is no distension.     Palpations: Abdomen is soft. There is no mass.     Tenderness: There is no abdominal tenderness. There is no guarding or rebound.  Genitourinary:    Prostate: Normal.     Rectum: Normal. Guaiac result negative.  Musculoskeletal:        General: No tenderness. Normal range of motion.  Cervical back: Normal range of motion.     Right lower leg: No edema.     Left lower leg: No edema.  Lymphadenopathy:     Cervical: No cervical adenopathy.  Skin:    General: Skin is warm and dry.     Findings: No rash.  Neurological:     Mental Status: He is alert and oriented to person, place, and time.     Cranial Nerves: No cranial nerve deficit.     Motor: No abnormal muscle tone.     Coordination: Coordination normal.     Gait: Gait normal.     Deep Tendon Reflexes: Reflexes are normal and symmetric.  Psychiatric:        Behavior: Behavior normal.        Thought Content: Thought content normal.        Judgment: Judgment normal.     Lab Results  Component Value Date   WBC 6.1 10/10/2022   HGB 13.7 10/10/2022   HCT 40.9 10/10/2022    PLT 227.0 10/10/2022   GLUCOSE 101 (H) 10/10/2022   CHOL 202 (H) 10/10/2022   TRIG 63.0 10/10/2022   HDL 40.60 10/10/2022   LDLDIRECT 98.0 09/26/2018   LDLCALC 149 (H) 10/10/2022   ALT 17 10/10/2022   AST 25 10/10/2022   NA 136 10/10/2022   K 3.9 10/10/2022   CL 100 10/10/2022   CREATININE 1.14 10/10/2022   BUN 25 (H) 10/10/2022   CO2 28 10/10/2022   TSH 10.73 (H) 10/10/2022   PSA 0.84 10/10/2022    CT Renal Stone Study Result Date: 11/09/2016 CLINICAL DATA:  Left flank pain. EXAM: CT ABDOMEN AND PELVIS WITHOUT CONTRAST TECHNIQUE: Multidetector CT imaging of the abdomen and pelvis was performed following the standard protocol without IV contrast. COMPARISON:  CT 08/03/2014 FINDINGS: Lower chest: The lung bases are clear. Hepatobiliary: Mild hepatic steatosis, no evidence of focal lesion. Gallbladder physiologically distended, no calcified stone. No biliary dilatation. Pancreas: No ductal dilatation or inflammation. Spleen: Normal in size without focal abnormality. Adrenals/Urinary Tract: Normal adrenal glands. Obstructing 4 x 4 mm stone in the distal left ureter just proximal to the ureterovesicular junction with moderate left hydroureteronephrosis. Moderate left perinephric edema. Multiple, at least 7, nonobstructing stones in the left kidney. No right hydronephrosis or hydroureter. Multiple, at least 8, nonobstructing stones in the right kidney. Urinary bladder is physiologically distended without stone or wall thickening. Stomach/Bowel: Diverticulosis throughout the entire colon without acute diverticulitis. Appendix not confidently visualized. No small bowel dilatation or inflammation. Stomach is nondistended. Vascular/Lymphatic: Mild aortic and branch atherosclerosis without aneurysm. No abdominal or pelvic adenopathy. Reproductive: Prostate is unremarkable. Other: No free air, free fluid, or intra-abdominal fluid collection. Tiny fat containing umbilical hernia. Musculoskeletal: There  are no acute or suspicious osseous abnormalities. IMPRESSION: 1. Obstructing 4 mm stone in the distal left ureter just proximal to the ureterovesicular junction with moderate hydronephrosis and perinephric edema. 2. Multiple additional nonobstructing bilateral renal calculi. 3. Multifocal colonic diverticulosis throughout the entire colon. No acute diverticulitis. 4. Hepatic steatosis. 5.  Aortic Atherosclerosis (ICD10-I70.0). Electronically Signed   By: Andrea Bernhardt M.D.   On: 11/09/2016 05:42    Assessment & Plan:   Problem List Items Addressed This Visit     B12 deficiency   On B12      Relevant Orders   Vitamin B12   Bradycardia   No sx's Reduce Toprol  to 0.5 tab/d      Essential hypertension   Chronic. Reduce Toprol   XL to 1/2 tab due to lightheadedness      Relevant Medications   metoprolol  succinate (TOPROL -XL) 25 MG 24 hr tablet   Hypothyroidism   Chronic On Levothroid      Relevant Medications   levothyroxine  (EUTHYROX ) 125 MCG tablet   metoprolol  succinate (TOPROL -XL) 25 MG 24 hr tablet   Well adult exam - Primary   We discussed age appropriate health related issues, including available/recomended screening tests and vaccinations. Labs were ordered to be later reviewed . All questions were answered. We discussed one or more of the following - seat belt use, use of sunscreen/sun exposure exercise, fall risk reduction, second hand smoke exposure, firearm use and storage, seat belt use, a need for adhering to healthy diet and exercise. Labs were ordered.  All questions were answered. A cardiac CT scan for coronary calcium offered 7/20, 7/21 Colon 2011, 2021- due in 2031      Relevant Orders   TSH   Urinalysis   CBC with Differential/Platelet   Lipid panel   PSA   Comprehensive metabolic panel with GFR      Meds ordered this encounter  Medications   levothyroxine  (EUTHYROX ) 125 MCG tablet    Sig: Take 1 tablet (125 mcg total) by mouth daily before  breakfast.    Dispense:  90 tablet    Refill:  3   metoprolol  succinate (TOPROL -XL) 25 MG 24 hr tablet    Sig: Take 1 tablet (25 mg total) by mouth daily.    Dispense:  90 tablet    Refill:  3      Follow-up: Return in about 1 year (around 10/16/2024) for a follow-up visit, Wellness Exam.  Marolyn Noel, MD

## 2023-10-17 NOTE — Assessment & Plan Note (Addendum)
Chronic. Reduce Toprol XL to 1/2 tab due to lightheadedness

## 2023-10-17 NOTE — Assessment & Plan Note (Signed)
 No sx's Reduce Toprol  to 0.5 tab/d

## 2023-10-17 NOTE — Assessment & Plan Note (Signed)
  We discussed age appropriate health related issues, including available/recomended screening tests and vaccinations. Labs were ordered to be later reviewed . All questions were answered. We discussed one or more of the following - seat belt use, use of sunscreen/sun exposure exercise, fall risk reduction, second hand smoke exposure, firearm use and storage, seat belt use, a need for adhering to healthy diet and exercise. Labs were ordered.  All questions were answered. A cardiac CT scan for coronary calcium offered 7/20, 7/21 Colon 2011, 2021- due in 2031

## 2023-10-17 NOTE — Assessment & Plan Note (Signed)
 On B12

## 2023-10-18 ENCOUNTER — Ambulatory Visit: Payer: Self-pay | Admitting: Internal Medicine

## 2023-10-24 DIAGNOSIS — L820 Inflamed seborrheic keratosis: Secondary | ICD-10-CM | POA: Diagnosis not present

## 2023-10-24 DIAGNOSIS — L2989 Other pruritus: Secondary | ICD-10-CM | POA: Diagnosis not present

## 2023-10-24 DIAGNOSIS — L538 Other specified erythematous conditions: Secondary | ICD-10-CM | POA: Diagnosis not present

## 2023-10-24 DIAGNOSIS — L814 Other melanin hyperpigmentation: Secondary | ICD-10-CM | POA: Diagnosis not present

## 2023-10-24 DIAGNOSIS — Z08 Encounter for follow-up examination after completed treatment for malignant neoplasm: Secondary | ICD-10-CM | POA: Diagnosis not present

## 2023-10-24 DIAGNOSIS — D2371 Other benign neoplasm of skin of right lower limb, including hip: Secondary | ICD-10-CM | POA: Diagnosis not present

## 2023-10-24 DIAGNOSIS — L570 Actinic keratosis: Secondary | ICD-10-CM | POA: Diagnosis not present

## 2023-10-24 DIAGNOSIS — L821 Other seborrheic keratosis: Secondary | ICD-10-CM | POA: Diagnosis not present

## 2023-10-24 DIAGNOSIS — Z85828 Personal history of other malignant neoplasm of skin: Secondary | ICD-10-CM | POA: Diagnosis not present

## 2023-11-09 ENCOUNTER — Encounter: Payer: Self-pay | Admitting: Internal Medicine

## 2023-11-15 MED ORDER — COVID-19 MRNA VAC-TRIS(PFIZER) 30 MCG/0.3ML IM SUSY: 0.3000 mL | PREFILLED_SYRINGE | Freq: Once | INTRAMUSCULAR | 0 refills | Status: AC

## 2024-01-01 ENCOUNTER — Encounter: Payer: Self-pay | Admitting: Radiology

## 2024-08-08 ENCOUNTER — Ambulatory Visit

## 2024-10-21 ENCOUNTER — Encounter: Admitting: Internal Medicine
